# Patient Record
Sex: Female | Born: 1995 | Hispanic: Yes | Marital: Single | Smoking: Current some day smoker
Health system: Southern US, Community
[De-identification: ages and names within clinical notes are randomized; demographics above are authoritative.]

## PROBLEM LIST (undated history)

## (undated) ENCOUNTER — Emergency Department (HOSPITAL_COMMUNITY): Admission: EM | Payer: Self-pay | Source: Home / Self Care

## (undated) ENCOUNTER — Inpatient Hospital Stay (HOSPITAL_COMMUNITY): Payer: Self-pay

## (undated) DIAGNOSIS — Z789 Other specified health status: Secondary | ICD-10-CM

## (undated) HISTORY — DX: Other specified health status: Z78.9

## (undated) HISTORY — PX: NO PAST SURGERIES: SHX2092

---
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
## 2014-03-12 ENCOUNTER — Emergency Department (HOSPITAL_COMMUNITY): Payer: Self-pay

## 2014-03-12 ENCOUNTER — Emergency Department (HOSPITAL_COMMUNITY)
Admission: EM | Admit: 2014-03-12 | Discharge: 2014-03-12 | Disposition: A | Discharge status: Home / Self Care | Payer: Self-pay | Attending: Emergency Medicine | Admitting: Emergency Medicine

## 2014-03-12 ENCOUNTER — Encounter (HOSPITAL_COMMUNITY): Payer: Self-pay | Admitting: Emergency Medicine

## 2014-03-12 DIAGNOSIS — Z331 Pregnant state, incidental: Secondary | ICD-10-CM | POA: Insufficient documentation

## 2014-03-12 DIAGNOSIS — Z3490 Encounter for supervision of normal pregnancy, unspecified, unspecified trimester: Secondary | ICD-10-CM

## 2014-03-12 LAB — URINALYSIS, ROUTINE W REFLEX MICROSCOPIC
BILIRUBIN URINE: NEGATIVE
Glucose, UA: NEGATIVE mg/dL
HGB URINE DIPSTICK: NEGATIVE
Ketones, ur: NEGATIVE mg/dL
Leukocytes, UA: NEGATIVE
Nitrite: NEGATIVE
PROTEIN: NEGATIVE mg/dL
Specific Gravity, Urine: 1.01 (ref 1.005–1.030)
UROBILINOGEN UA: 0.2 mg/dL (ref 0.0–1.0)
pH: 6.5 (ref 5.0–8.0)

## 2014-03-12 LAB — COMPREHENSIVE METABOLIC PANEL
ALK PHOS: 82 U/L (ref 39–117)
ALT: 13 U/L (ref 0–35)
AST: 25 U/L (ref 0–37)
Albumin: 4 g/dL (ref 3.5–5.2)
BILIRUBIN TOTAL: 0.4 mg/dL (ref 0.3–1.2)
BUN: 8 mg/dL (ref 6–23)
CHLORIDE: 104 meq/L (ref 96–112)
CO2: 26 mEq/L (ref 19–32)
Calcium: 9.1 mg/dL (ref 8.4–10.5)
Creatinine, Ser: 0.57 mg/dL (ref 0.50–1.10)
GFR calc non Af Amer: >90 mL/min (ref >90)
GLUCOSE: 98 mg/dL (ref 70–99)
POTASSIUM: 4.1 meq/L (ref 3.7–5.3)
Sodium: 141 mEq/L (ref 137–147)
TOTAL PROTEIN: 7.5 g/dL (ref 6.0–8.3)

## 2014-03-12 LAB — CBC WITH DIFFERENTIAL/PLATELET
Basophils Absolute: 0 10*3/uL (ref 0.0–0.1)
Basophils Relative: 0 % (ref 0–1)
EOS ABS: 0.2 10*3/uL (ref 0.0–0.7)
Eosinophils Relative: 3 % (ref 0–5)
HEMATOCRIT: 40.8 % (ref 36.0–46.0)
HEMOGLOBIN: 14.3 g/dL (ref 12.0–15.0)
LYMPHS ABS: 2.4 10*3/uL (ref 0.7–4.0)
Lymphocytes Relative: 32 % (ref 12–46)
MCH: 32.1 pg (ref 26.0–34.0)
MCHC: 35 g/dL (ref 30.0–36.0)
MCV: 91.5 fL (ref 78.0–100.0)
MONO ABS: 0.6 10*3/uL (ref 0.1–1.0)
MONOS PCT: 8 % (ref 3–12)
NEUTROS PCT: 57 % (ref 43–77)
Neutro Abs: 4.3 10*3/uL (ref 1.7–7.7)
Platelets: 279 10*3/uL (ref 150–400)
RBC: 4.46 MIL/uL (ref 3.87–5.11)
RDW: 13.4 % (ref 11.5–15.5)
WBC: 7.6 10*3/uL (ref 4.0–10.5)

## 2014-03-12 LAB — HCG, QUANTITATIVE, PREGNANCY: HCG, BETA CHAIN, QUANT, S: 1302 m[IU]/mL — AB (ref <5)

## 2014-03-12 LAB — PREGNANCY, URINE: Preg Test, Ur: POSITIVE — AB

## 2014-03-12 LAB — ABO/RH: ABO/RH(D): O POS

## 2014-03-12 NOTE — ED Notes (Signed)
lmp 01/20/2014. States she had a positive pregnancy test 2 days ago, states she has pain in her abdomen today and some bleeding when she wipes

## 2014-03-12 NOTE — Discharge Instructions (Signed)
Follow up with dr. Elonda Husky next week.

## 2014-03-12 NOTE — ED Provider Notes (Signed)
CSN: [id number]     Arrival date & time 03/12/14  1652 History  This chart was scribed for Ashlee Diego, MD by Randa Evens, ED Scribe. This patient was seen in room APA08/APA08 and the patient's care was started at 5:56 PM.    Chief Complaint  Patient presents with  . Vaginal Bleeding      Patient is a 18 y.o. female presenting with vaginal bleeding. The history is provided by the patient. No language interpreter was used.  Vaginal Bleeding Quality:  Spotting Severity:  Mild Onset quality:  Sudden Duration:  1 day Timing:  Intermittent Progression:  Unchanged Chronicity:  New Possible pregnancy: yes   Relieved by:  None tried Worsened by:  Nothing tried Ineffective treatments:  None tried Associated symptoms: abdominal pain   Associated symptoms: no back pain and no fatigue    HPI Comments: Ashlee Vincent is a 18 y.o. female who presents to the Emergency Department complaining of vaginal bleeding described as "spotty" onset today. She states she has associated symptoms of mild suprapubic abdominal pain. Pt says her she took a positive. pregnancy test 2 days ago and her LNMP was  01/20/2014   History reviewed. No pertinent past medical history. History reviewed. No pertinent past surgical history. No family history on file. History  Substance Use Topics  . Smoking status: Never Smoker   . Smokeless tobacco: Not on file  . Alcohol Use: No   OB History   Grav Para Term Preterm Abortions TAB SAB Ect Mult Living                 Review of Systems  Constitutional: Negative for appetite change and fatigue.  HENT: Negative for congestion, ear discharge and sinus pressure.   Eyes: Negative for discharge.  Respiratory: Negative for cough.   Cardiovascular: Negative for chest pain.  Gastrointestinal: Positive for abdominal pain. Negative for diarrhea.  Genitourinary: Positive for vaginal bleeding. Negative for frequency and hematuria.  Musculoskeletal: Negative for  back pain.  Skin: Negative for rash.  Neurological: Negative for seizures and headaches.  Psychiatric/Behavioral: Negative for hallucinations.      Allergies  Review of patient's allergies indicates no known allergies.  Home Medications   Prior to Admission medications   Medication Sig Start Date End Date Taking? Authorizing Provider  acetaminophen (TYLENOL) 500 MG tablet Take 500 mg by mouth every 6 (six) hours as needed. Pain    Historical Provider, MD  etonogestrel (IMPLANON) 68 MG IMPL implant Inject 1 each into the skin once.    Historical Provider, MD  ibuprofen (ADVIL,MOTRIN) 800 MG tablet Take one every 8 hours for pain 06/29/12   Ashlee Diego, MD  promethazine (PHENERGAN) 25 MG tablet Take 1 tablet (25 mg total) by mouth every 6 (six) hours as needed for nausea. 06/29/12 07/06/12  Ashlee Diego, MD   Triage Vitals: BP 111/58  Pulse 65  Temp(Src) 98 F (36.7 C) (Oral)  Resp 20  Ht 4\' 11"  (1.499 m)  Wt 180 lb (81.647 kg)  BMI 36.34 kg/m2  SpO2 100%  LMP 01/20/2014  Physical Exam  Constitutional: She is oriented to person, place, and time. She appears well-developed.  HENT:  Head: Normocephalic.  Eyes: Conjunctivae and EOM are normal. No scleral icterus.  Neck: Neck supple. No thyromegaly present.  Cardiovascular: Normal rate and regular rhythm.  Exam reveals no gallop and no friction rub.   No murmur heard. Pulmonary/Chest: No stridor. She has no wheezes. She has no rales.  She exhibits no tenderness.  Abdominal: She exhibits no distension. There is tenderness. There is no rebound.  Mild suprapubic tenderness   Musculoskeletal: Normal range of motion. She exhibits no edema.  Lymphadenopathy:    She has no cervical adenopathy.  Neurological: She is oriented to person, place, and time. She exhibits normal muscle tone. Coordination normal.  Skin: No rash noted. No erythema.  Psychiatric: She has a normal mood and affect. Her behavior is normal.    ED Course   Procedures (including critical care time) DIAGNOSTIC STUDIES: Oxygen Saturation is 100% on RA, normal by my interpretation.    COORDINATION OF CARE: 5:59 PM-Discussed treatment plan which includes CBC panel, CMP, UA, Pregnancy Test with pt at bedside and pt agreed to plan. Will also perform a pelvic exam.    Labs Review Labs Reviewed  HCG, QUANTITATIVE, PREGNANCY - Abnormal; Notable for the following:    hCG, Beta Chain, Quant, S 1302 (*)    All other components within normal limits  PREGNANCY, URINE - Abnormal; Notable for the following:    Preg Test, Ur POSITIVE (*)    All other components within normal limits  CBC WITH DIFFERENTIAL  COMPREHENSIVE METABOLIC PANEL  URINALYSIS, ROUTINE W REFLEX MICROSCOPIC  ABO/RH    Imaging Review No results found.   EKG Interpretation None      MDM   Final diagnoses:  None   The chart was scribed for me under my direct supervision.  I personally performed the history, physical, and medical decision making and all procedures in the evaluation of this patient.Ashlee Diego, MD 03/12/14 [phone]

## 2014-03-14 ENCOUNTER — Encounter (HOSPITAL_COMMUNITY): Payer: Self-pay | Admitting: Emergency Medicine

## 2014-03-14 ENCOUNTER — Encounter: Payer: Self-pay | Admitting: Obstetrics & Gynecology

## 2014-03-14 ENCOUNTER — Emergency Department (HOSPITAL_COMMUNITY)
Admission: EM | Admit: 2014-03-14 | Discharge: 2014-03-14 | Disposition: A | Discharge status: Home / Self Care | Payer: Self-pay | Attending: Emergency Medicine | Admitting: Emergency Medicine

## 2014-03-14 ENCOUNTER — Ambulatory Visit (INDEPENDENT_AMBULATORY_CARE_PROVIDER_SITE_OTHER): Payer: Self-pay | Admitting: Obstetrics & Gynecology

## 2014-03-14 VITALS — BP 90/70 | Wt 179.0 lb

## 2014-03-14 DIAGNOSIS — O9989 Other specified diseases and conditions complicating pregnancy, childbirth and the puerperium: Secondary | ICD-10-CM | POA: Insufficient documentation

## 2014-03-14 DIAGNOSIS — R142 Eructation: Secondary | ICD-10-CM

## 2014-03-14 DIAGNOSIS — R109 Unspecified abdominal pain: Secondary | ICD-10-CM

## 2014-03-14 DIAGNOSIS — R141 Gas pain: Secondary | ICD-10-CM | POA: Insufficient documentation

## 2014-03-14 DIAGNOSIS — O200 Threatened abortion: Secondary | ICD-10-CM | POA: Insufficient documentation

## 2014-03-14 DIAGNOSIS — O039 Complete or unspecified spontaneous abortion without complication: Secondary | ICD-10-CM

## 2014-03-14 DIAGNOSIS — R143 Flatulence: Secondary | ICD-10-CM

## 2014-03-14 DIAGNOSIS — N939 Abnormal uterine and vaginal bleeding, unspecified: Secondary | ICD-10-CM

## 2014-03-14 LAB — WET PREP, GENITAL
CLUE CELLS WET PREP: NONE SEEN
TRICH WET PREP: NONE SEEN
YEAST WET PREP: NONE SEEN

## 2014-03-14 LAB — HCG, QUANTITATIVE, PREGNANCY: hCG, Beta Chain, Quant, S: 1290 m[IU]/mL — ABNORMAL HIGH (ref <5)

## 2014-03-14 MED ORDER — HYDROCODONE-ACETAMINOPHEN 5-325 MG PO TABS: 2.0000 | ORAL_TABLET | ORAL | Status: DC | PRN

## 2014-03-14 MED ORDER — SODIUM CHLORIDE 0.9 % IV SOLN
Freq: Once | INTRAVENOUS | Status: AC
  Administered 2014-03-14: 01:00:00 via INTRAVENOUS

## 2014-03-14 MED ORDER — MORPHINE SULFATE 4 MG/ML IJ SOLN: 2.0000 mg | Freq: Once | INTRAMUSCULAR | Status: AC

## 2014-03-14 MED ORDER — MORPHINE SULFATE 2 MG/ML IJ SOLN
INTRAMUSCULAR | Status: AC
  Administered 2014-03-14: 2 mg via INTRAVENOUS
  Filled 2014-03-14: qty 1

## 2014-03-14 MED FILL — Hydrocodone-Acetaminophen Tab 5-325 MG: ORAL | Qty: 6 | Status: AC

## 2014-03-14 NOTE — Progress Notes (Signed)
Patient ID: Ashlee Vincent, female   DOB: 09-May-1996, 18 y.o.   MRN: [id number] Pt with flat HCGsx2 and sonogram with no definitve findings Repeat HCG on Wednesday and will call pt with report  Results for orders placed during the hospital encounter of 03/14/14 (from the past 168 hour(s))  HCG, QUANTITATIVE, PREGNANCY   Collection Time    03/14/14 12:45 AM      Result Value Ref Range   hCG, Beta Chain, Quant, S 1290 (*) <5 mIU/mL  WET PREP, GENITAL   Collection Time    03/14/14  1:03 AM      Result Value Ref Range   Yeast Wet Prep HPF POC NONE SEEN  NONE SEEN   Trich, Wet Prep NONE SEEN  NONE SEEN   Clue Cells Wet Prep HPF POC NONE SEEN  NONE SEEN   WBC, Wet Prep HPF POC FEW (*) NONE SEEN  Results for orders placed during the hospital encounter of 03/12/14 (from the past 168 hour(s))  URINALYSIS, ROUTINE W REFLEX MICROSCOPIC   Collection Time    03/12/14  5:43 PM      Result Value Ref Range   Color, Urine YELLOW  YELLOW   APPearance CLEAR  CLEAR   Specific Gravity, Urine 1.010  1.005 - 1.030   pH 6.5  5.0 - 8.0   Glucose, UA NEGATIVE  NEGATIVE mg/dL   Hgb urine dipstick NEGATIVE  NEGATIVE   Bilirubin Urine NEGATIVE  NEGATIVE   Ketones, ur NEGATIVE  NEGATIVE mg/dL   Protein, ur NEGATIVE  NEGATIVE mg/dL   Urobilinogen, UA 0.2  0.0 - 1.0 mg/dL   Nitrite NEGATIVE  NEGATIVE   Leukocytes, UA NEGATIVE  NEGATIVE  PREGNANCY, URINE   Collection Time    03/12/14  5:43 PM      Result Value Ref Range   Preg Test, Ur POSITIVE (*) NEGATIVE  CBC WITH DIFFERENTIAL   Collection Time    03/12/14  6:21 PM      Result Value Ref Range   WBC 7.6  4.0 - 10.5 K/uL   RBC 4.46  3.87 - 5.11 MIL/uL   Hemoglobin 14.3  12.0 - 15.0 g/dL   HCT 40.8  36.0 - 46.0 %   MCV 91.5  78.0 - 100.0 fL   MCH 32.1  26.0 - 34.0 pg   MCHC 35.0  30.0 - 36.0 g/dL   RDW 13.4  11.5 - 15.5 %   Platelets 279  150 - 400 K/uL   Neutrophils Relative % 57  43 - 77 %   Neutro Abs 4.3  1.7 - 7.7 K/uL   Lymphocytes  Relative 32  12 - 46 %   Lymphs Abs 2.4  0.7 - 4.0 K/uL   Monocytes Relative 8  3 - 12 %   Monocytes Absolute 0.6  0.1 - 1.0 K/uL   Eosinophils Relative 3  0 - 5 %   Eosinophils Absolute 0.2  0.0 - 0.7 K/uL   Basophils Relative 0  0 - 1 %   Basophils Absolute 0.0  0.0 - 0.1 K/uL  COMPREHENSIVE METABOLIC PANEL   Collection Time    03/12/14  6:21 PM      Result Value Ref Range   Sodium 141  137 - 147 mEq/L   Potassium 4.1  3.7 - 5.3 mEq/L   Chloride 104  96 - 112 mEq/L   CO2 26  19 - 32 mEq/L   Glucose, Bld 98  70 - 99 mg/dL  BUN 8  6 - 23 mg/dL   Creatinine, Ser 0.57  0.50 - 1.10 mg/dL   Calcium 9.1  8.4 - 10.5 mg/dL   Total Protein 7.5  6.0 - 8.3 g/dL   Albumin 4.0  3.5 - 5.2 g/dL   AST 25  0 - 37 U/L   ALT 13  0 - 35 U/L   Alkaline Phosphatase 82  39 - 117 U/L   Total Bilirubin 0.4  0.3 - 1.2 mg/dL   GFR calc non Af Amer >90  >90 mL/min   GFR calc Af Amer >90  >90 mL/min  HCG, QUANTITATIVE, PREGNANCY   Collection Time    03/12/14  6:21 PM      Result Value Ref Range   hCG, Beta Chain, Quant, S 1302 (*) <5 mIU/mL  ABO/RH   Collection Time    03/12/14  6:24 PM      Result Value Ref Range   ABO/RH(D) O POS      US Ob Transvaginal  03/12/2014   CLINICAL DATA:  Vaginal bleeding.  Pregnant  EXAM: TRANSVAGINAL OB ULTRASOUND  TECHNIQUE: Transvaginal ultrasound was performed for complete evaluation of the gestation as well as the maternal uterus, adnexal regions, and pelvic cul-de-sac.  COMPARISON:  None.  FINDINGS: Intrauterine gestational sac: Present  Yolk sac:  Not identified  Embryo:  Not identified  MSD: 5.6  mm   5 w   2                   [place] EDC: 11/10/2014  Maternal uterus/adnexae: Normal ovaries. No free fluid. Corpus luteal cyst of the right ovary. Trace free fluid.  IMPRESSION: Probable early intrauterine gestational sac, but no yolk sac, fetal pole, or cardiac activity yet visualized. Recommend follow-up quantitative B-HCG levels and follow-up US in 14 days to confirm and  assess viability. This recommendation follows SRU consensus guidelines: Diagnostic Criteria for Nonviable Pregnancy Early in the First Trimester. [employer] Med 2013; 130:8657-84.   Electronically Signed   By: Suzy Bouchard M.D.   On: 03/12/2014 21:33    History reviewed. No pertinent past medical history.  History reviewed. No pertinent past surgical history.  OB History   Grav Para Term Preterm Abortions TAB SAB Ect Mult Living   1               No Known Allergies  History   Social History  . Marital Status: Single    Spouse Name: N/A    Number of Children: N/A  . Years of Education: N/A   Social History Main Topics  . Smoking status: Never Smoker   . Smokeless tobacco: None  . Alcohol Use: No  . Drug Use: No  . Sexual Activity: Yes    Birth Control/ Protection: Implant   Other Topics Concern  . None   Social History Narrative  . None    Family History  Problem Relation Age of Onset  . Hyperlipidemia Father   . Diabetes Maternal Grandmother   . Diabetes Paternal Grandmother

## 2014-03-14 NOTE — ED Notes (Signed)
Patient states she is [redacted] weeks pregnant; states she was seen here yesterday for vaginal bleeding and was told nothing was wrong.  Patient began having heavier bleeding that started now and c/o abdominal pain that is "stronger than period cramps" since 8pm.

## 2014-03-14 NOTE — ED Provider Notes (Signed)
CSN: [id number]     Arrival date & time 03/14/14  0027 History  This chart was scribed for Johnna Acosta, MD by Randa Evens, ED Scribe. This patient was seen in room APA01/APA01 and the patient's care was started at 12:33 AM.     Chief Complaint  Patient presents with  . Vaginal Bleeding   The history is provided by the patient. No language interpreter was used.   HPI Comments: Ashlee Vincent is a 18 y.o. female who presents to the Emergency Department complaining of suprapubic abdominal pain onset yesterday that improved today, but worsened and became severe tonight. She was seen in the ED yesterday with vaginal bleeding ("spotting") and mild suprapubic abdominal pain. She had an Ultrasound yesterday showing a gestational sac, but no obvious ectopic pregnancy. Her hCG quant yesterday was 1300. She states that today she wiped and noticed more blood than she noticed yesterday. She is G2P1A0 with a healthy 4 y.o. Child. She denies having similar pain during her 1st pregnancy. She denies any surgical abdominal history. She denies any history of cardiac or respiratory issues.   History reviewed. No pertinent past medical history. History reviewed. No pertinent past surgical history. No family history on file. History  Substance Use Topics  . Smoking status: Never Smoker   . Smokeless tobacco: Not on file  . Alcohol Use: No   OB History   Grav Para Term Preterm Abortions TAB SAB Ect Mult Living   1              Review of Systems  Gastrointestinal: Positive for abdominal distention. Negative for vomiting.      Allergies  Review of patient's allergies indicates no known allergies.  Home Medications   Prior to Admission medications   Medication Sig Start Date End Date Taking? Authorizing Provider  Multiple Vitamin (MULTIVITAMIN WITH MINERALS) TABS tablet Take 1 tablet by mouth daily.    Historical Provider, MD   BP 137/67  Pulse 104  Temp(Src) 98.1 F (36.7 C) (Oral)  Resp  20  Ht 4\' 11"  (1.499 m)  Wt 188 lb (85.276 kg)  BMI 37.95 kg/m2  SpO2 100%  LMP 01/20/2014  Physical Exam  Nursing note and vitals reviewed. Constitutional: She is oriented to person, place, and time. She appears well-developed and well-nourished.  Uncomfortable appearing  HENT:  Head: Normocephalic and atraumatic.  Eyes: Conjunctivae and EOM are normal.  Neck: Normal range of motion. Neck supple.  No meningeal signs  Cardiovascular: Normal rate, regular rhythm and normal heart sounds.  Exam reveals no gallop and no friction rub.   No murmur heard. Pulmonary/Chest: Effort normal and breath sounds normal. No respiratory distress. She has no wheezes. She has no rales. She exhibits no tenderness.  Abdominal: Soft. Bowel sounds are normal. She exhibits no distension and no mass. There is tenderness. There is no rebound and no guarding.  Suprapubic tenderness.   Genitourinary:  Chaperone present for exam, has normal appearing external genitalia - blood present in the vaginal vault.  Cervix open with blood and tissue at os, no CMT, no adnexal ttp or masses.  No FB, no d/c, no foul odor  Musculoskeletal: Normal range of motion. She exhibits no edema and no tenderness.  Neurological: She is alert and oriented to person, place, and time. No cranial nerve deficit.  Skin: Skin is warm and dry. She is not diaphoretic. No erythema.    ED Course  Procedures (including critical care time) DIAGNOSTIC STUDIES: Oxygen Saturation is  100%% on ra normal,  by my interpretation.    COORDINATION OF CARE:  12:38 AM-Discussed treatment plan which includes pelvic exam, hCG and medications with pt at bedside and pt agreed to plan.    Labs Review Labs Reviewed  WET PREP, GENITAL - Abnormal; Notable for the following:    WBC, Wet Prep HPF POC FEW (*)    All other components within normal limits  HCG, QUANTITATIVE, PREGNANCY - Abnormal; Notable for the following:    hCG, Beta Chain, Quant, S 1290 (*)     All other components within normal limits  GC/CHLAMYDIA PROBE AMP    Imaging Review US Ob Transvaginal  03/12/2014   CLINICAL DATA:  Vaginal bleeding.  Pregnant  EXAM: TRANSVAGINAL OB ULTRASOUND  TECHNIQUE: Transvaginal ultrasound was performed for complete evaluation of the gestation as well as the maternal uterus, adnexal regions, and pelvic cul-de-sac.  COMPARISON:  None.  FINDINGS: Intrauterine gestational sac: Present  Yolk sac:  Not identified  Embryo:  Not identified  MSD: 5.6  mm   5 w   2                   [place] EDC: 11/10/2014  Maternal uterus/adnexae: Normal ovaries. No free fluid. Corpus luteal cyst of the right ovary. Trace free fluid.  IMPRESSION: Probable early intrauterine gestational sac, but no yolk sac, fetal pole, or cardiac activity yet visualized. Recommend follow-up quantitative B-HCG levels and follow-up US in 14 days to confirm and assess viability. This recommendation follows SRU consensus guidelines: Diagnostic Criteria for Nonviable Pregnancy Early in the First Trimester. [employer] Med 2013; 580:9983-38.   Electronically Signed   By: Suzy Bouchard M.D.   On: 03/12/2014 21:33      MDM   Final diagnoses:  Threatened miscarriage  Vaginal bleeding  Abdominal pain    The pt is likely having a miscarriage - this is a threatened abortion, she has a quant pending, VS unremarkable and abd exam unremarkable - doubt ectopic given US findings from yesterday (see above) and presence of blood and tissue with open cervical os.  Pain meds given.  Quant unchanged from last check - d/w Dr. Elonda Husky - he agrees with f/u as outpt - no indication for transfer for further testing this evening.  I personally performed the services described in this documentation, which was scribed in my presence. The recorded information has been reviewed and is accurate.       Johnna Acosta, MD 03/14/14 [phone]

## 2014-03-15 LAB — GC/CHLAMYDIA PROBE AMP
CT Probe RNA: NEGATIVE
GC Probe RNA: NEGATIVE

## 2014-03-16 ENCOUNTER — Other Ambulatory Visit: Payer: Self-pay

## 2014-03-16 DIAGNOSIS — Z3200 Encounter for pregnancy test, result unknown: Secondary | ICD-10-CM

## 2014-03-17 LAB — HCG, QUANTITATIVE, PREGNANCY: HCG, BETA CHAIN, QUANT, S: 246.8 m[IU]/mL

## 2014-06-06 ENCOUNTER — Other Ambulatory Visit: Payer: Self-pay | Admitting: Obstetrics and Gynecology

## 2014-06-06 DIAGNOSIS — O3680X0 Pregnancy with inconclusive fetal viability, not applicable or unspecified: Secondary | ICD-10-CM

## 2014-06-08 ENCOUNTER — Ambulatory Visit (INDEPENDENT_AMBULATORY_CARE_PROVIDER_SITE_OTHER): Payer: Medicaid Other

## 2014-06-08 ENCOUNTER — Other Ambulatory Visit: Payer: Self-pay | Admitting: Obstetrics and Gynecology

## 2014-06-08 DIAGNOSIS — O26849 Uterine size-date discrepancy, unspecified trimester: Secondary | ICD-10-CM

## 2014-06-08 DIAGNOSIS — O09299 Supervision of pregnancy with other poor reproductive or obstetric history, unspecified trimester: Secondary | ICD-10-CM

## 2014-06-08 DIAGNOSIS — O3680X0 Pregnancy with inconclusive fetal viability, not applicable or unspecified: Secondary | ICD-10-CM

## 2014-06-08 NOTE — Progress Notes (Signed)
U/S-single IUP with +FCA noted, FHR-120 bpm, CRL c/w 5+5 wks , EDD 02/03/2015, cx appears closed, bilateral adnexa appears WNL with C.L. Noted on Rt, no free fluid noted within the pelvis

## 2014-06-13 ENCOUNTER — Encounter: Payer: Self-pay | Admitting: Women's Health

## 2014-06-13 ENCOUNTER — Ambulatory Visit (INDEPENDENT_AMBULATORY_CARE_PROVIDER_SITE_OTHER): Payer: Medicaid Other | Admitting: Women's Health

## 2014-06-13 VITALS — BP 114/60 | Wt 178.0 lb

## 2014-06-13 DIAGNOSIS — Z331 Pregnant state, incidental: Secondary | ICD-10-CM | POA: Diagnosis not present

## 2014-06-13 DIAGNOSIS — Z1389 Encounter for screening for other disorder: Secondary | ICD-10-CM

## 2014-06-13 DIAGNOSIS — Z3480 Encounter for supervision of other normal pregnancy, unspecified trimester: Secondary | ICD-10-CM | POA: Diagnosis not present

## 2014-06-13 DIAGNOSIS — Z3481 Encounter for supervision of other normal pregnancy, first trimester: Secondary | ICD-10-CM

## 2014-06-13 LAB — CBC
HCT: 39.5 % (ref 36.0–46.0)
Hemoglobin: 13.7 g/dL (ref 12.0–15.0)
MCH: 31.7 pg (ref 26.0–34.0)
MCHC: 34.7 g/dL (ref 30.0–36.0)
MCV: 91.4 fL (ref 78.0–100.0)
PLATELETS: 299 10*3/uL (ref 150–400)
RBC: 4.32 MIL/uL (ref 3.87–5.11)
RDW: 14 % (ref 11.5–15.5)
WBC: 6.8 10*3/uL (ref 4.0–10.5)

## 2014-06-13 LAB — POCT URINALYSIS DIPSTICK
Blood, UA: NEGATIVE
GLUCOSE UA: NEGATIVE
Ketones, UA: NEGATIVE
LEUKOCYTES UA: NEGATIVE
Nitrite, UA: NEGATIVE
Protein, UA: NEGATIVE

## 2014-06-13 NOTE — Progress Notes (Addendum)
  Subjective:  Ashlee Vincent is a 18 y.o. G41P1011 Hispanic female at [redacted]w[redacted]d by 5wk u/s, being seen today for her first obstetrical visit.  Her obstetrical history is significant for term uncomplicated SVD @ 71HA and recent SAB April 2015.  Pregnancy history fully reviewed.  Patient reports nausea, no vomiting- declines meds. Denies vb, cramping, uti s/s, abnormal/malodorous vag d/c, or vulvovaginal itching/irritation.   BP 114/60  Wt 178 lb (80.74 kg)  LMP 04/21/2014  HISTORY: OB History  Gravida Para Term Preterm AB SAB TAB Ectopic Multiple Living  3 1 0 0 1 1 0 0 0 1     # Outcome Date GA Lbr Len/2nd Weight Sex Delivery Anes PTL Lv  3 CUR           2 SAB 02/16/14          1 PAR 02/21/10    F SVD   Y     Past Medical History  Diagnosis Date  . Medical history non-contributory    Past Surgical History  Procedure Laterality Date  . No past surgeries     Family History  Problem Relation Age of Onset  . Hyperlipidemia Father   . Diabetes Maternal Grandmother   . Diabetes Paternal Grandmother     Exam   System:     General: Well developed & nourished, no acute distress   Skin: Warm & dry, normal coloration and turgor, no rashes   Neurologic: Alert & oriented, normal mood   Cardiovascular: Regular rate & rhythm   Respiratory: Effort & rate normal, LCTAB, acyanotic   Abdomen: Soft, non tender   Extremities: normal strength, tone   Thin prep pap smear n/a <21yo  FHR: 114 via informal transvaginal u/s by JAG   Assessment:   Pregnancy: [id number] Patient Active Problem List   Diagnosis Date Noted  . Supervision of other normal pregnancy 06/13/2014    Priority: High  . Miscarriage 03/14/2014    [redacted]w[redacted]d G3P0011 New OB visit Recent SAB 02/2014   Plan:  Initial labs drawn Continue prenatal vitamins Problem list reviewed and updated Reviewed n/v relief measures and warning s/s to report Reviewed recommended weight gain based on pre-gravid BMI Encouraged well-balanced  diet Genetic Screening discussed Integrated Screen: requested Cystic fibrosis screening discussed requested Ultrasound discussed; fetal survey: requested Follow up in 4 weeks for visit [place] completed  Tawnya Crook CNM, [facility] 06/13/2014 4:47 PM

## 2014-06-13 NOTE — Patient Instructions (Signed)
Nausea & Vomiting  Have saltine crackers or pretzels by your bed and eat a few bites before you raise your head out of bed in the morning  Eat small frequent meals throughout the day instead of large meals  Drink plenty of fluids throughout the day to stay hydrated, just don't drink a lot of fluids with your meals.  This can make your stomach fill up faster making you feel sick  Do not brush your teeth right after you eat  Products with real ginger are good for nausea, like ginger ale and ginger hard candy Make sure it says made with real ginger!  Sucking on sour candy like lemon heads is also good for nausea  If your prenatal vitamins make you nauseated, take them at night so you will sleep through the nausea  If you feel like you need medicine for the nausea & vomiting please let us know  If you are unable to keep any fluids or food down please let us know  Sea Bands    First Trimester of Pregnancy The first trimester of pregnancy is from week 1 until the end of week 12 (months 1 through 3). A week after a sperm fertilizes an egg, the egg will implant on the wall of the uterus. This embryo will begin to develop into a baby. Genes from you and your partner are forming the baby. The female genes determine whether the baby is a boy or a girl. At 6-8 weeks, the eyes and face are formed, and the heartbeat can be seen on ultrasound. At the end of 12 weeks, all the baby's organs are formed.  Now that you are pregnant, you will want to do everything you can to have a healthy baby. Two of the most important things are to get good prenatal care and to follow your health care provider's instructions. Prenatal care is all the medical care you receive before the baby's birth. This care will help prevent, find, and treat any problems during the pregnancy and childbirth. BODY CHANGES Your body goes through many changes during pregnancy. The changes vary from woman to woman.   You may gain or lose a  couple of pounds at first.  You may feel sick to your stomach (nauseous) and throw up (vomit). If the vomiting is uncontrollable, call your health care provider.  You may tire easily.  You may develop headaches that can be relieved by medicines approved by your health care provider.  You may urinate more often. Painful urination may mean you have a bladder infection.  You may develop heartburn as a result of your pregnancy.  You may develop constipation because certain hormones are causing the muscles that push waste through your intestines to slow down.  You may develop hemorrhoids or swollen, bulging veins (varicose veins).  Your breasts may begin to grow larger and become tender. Your nipples may stick out more, and the tissue that surrounds them (areola) may become darker.  Your gums may bleed and may be sensitive to brushing and flossing.  Dark spots or blotches (chloasma, mask of pregnancy) may develop on your face. This will likely fade after the baby is born.  Your menstrual periods will stop.  You may have a loss of appetite.  You may develop cravings for certain kinds of food.  You may have changes in your emotions from day to day, such as being excited to be pregnant or being concerned that something may go wrong with the pregnancy and  You may have more vivid and strange dreams.  You may have changes in your hair. These can include thickening of your hair, rapid growth, and changes in texture. Some women also have hair loss during or after pregnancy, or hair that feels dry or thin. Your hair will most likely return to normal after your baby is born. WHAT TO EXPECT AT YOUR PRENATAL VISITS During a routine prenatal visit:  You will be weighed to make sure you and the baby are growing normally.  Your blood pressure will be taken.  Your abdomen will be measured to track your baby's growth.  The fetal heartbeat will be listened to starting around week 10 or 12  of your pregnancy.  Test results from any previous visits will be discussed. Your health care provider may ask you:  How you are feeling.  If you are feeling the baby move.  If you have had any abnormal symptoms, such as leaking fluid, bleeding, severe headaches, or abdominal cramping.  If you have any questions. Other tests that may be performed during your first trimester include:  Blood tests to find your blood type and to check for the presence of any previous infections. They will also be used to check for low iron levels (anemia) and Rh antibodies. Later in the pregnancy, blood tests for diabetes will be done along with other tests if problems develop.  Urine tests to check for infections, diabetes, or protein in the urine.  An ultrasound to confirm the proper growth and development of the baby.  An amniocentesis to check for possible genetic problems.  Fetal screens for spina bifida and Down syndrome.  You may need other tests to make sure you and the baby are doing well. HOME CARE INSTRUCTIONS  Medicines  Follow your health care provider's instructions regarding medicine use. Specific medicines may be either safe or unsafe to take during pregnancy.  Take your prenatal vitamins as directed.  If you develop constipation, try taking a stool softener if your health care provider approves. Diet  Eat regular, well-balanced meals. Choose a variety of foods, such as meat or vegetable-based protein, fish, milk and low-fat dairy products, vegetables, fruits, and whole grain breads and cereals. Your health care provider will help you determine the amount of weight gain that is right for you.  Avoid raw meat and uncooked cheese. These carry germs that can cause birth defects in the baby.  Eating four or five small meals rather than three large meals a day may help relieve nausea and vomiting. If you start to feel nauseous, eating a few soda crackers can be helpful. Drinking liquids  between meals instead of during meals also seems to help nausea and vomiting.  If you develop constipation, eat more high-fiber foods, such as fresh vegetables or fruit and whole grains. Drink enough fluids to keep your urine clear or pale yellow. Activity and Exercise  Exercise only as directed by your health care provider. Exercising will help you:  Control your weight.  Stay in shape.  Be prepared for labor and delivery.  Experiencing pain or cramping in the lower abdomen or low back is a good sign that you should stop exercising. Check with your health care provider before continuing normal exercises.  Try to avoid standing for long periods of time. Move your legs often if you must stand in one place for a long time.  Avoid heavy lifting.  Wear low-heeled shoes, and practice good posture.  You may continue to have   sex unless your health care provider directs you otherwise. Relief of Pain or Discomfort  Wear a good support bra for breast tenderness.   Take warm sitz baths to soothe any pain or discomfort caused by hemorrhoids. Use hemorrhoid cream if your health care provider approves.   Rest with your legs elevated if you have leg cramps or low back pain.  If you develop varicose veins in your legs, wear support hose. Elevate your feet for 15 minutes, 3-4 times a day. Limit salt in your diet. Prenatal Care  Schedule your prenatal visits by the twelfth week of pregnancy. They are usually scheduled monthly at first, then more often in the last 2 months before delivery.  Write down your questions. Take them to your prenatal visits.  Keep all your prenatal visits as directed by your health care provider. Safety  Wear your seat belt at all times when driving.  Make a list of emergency phone numbers, including numbers for family, friends, the hospital, and police and fire departments. General Tips  Ask your health care provider for a referral to a local prenatal education  class. Begin classes no later than at the beginning of month 6 of your pregnancy.  Ask for help if you have counseling or nutritional needs during pregnancy. Your health care provider can offer advice or refer you to specialists for help with various needs.  Do not use hot tubs, steam rooms, or saunas.  Do not douche or use tampons or scented sanitary pads.  Do not cross your legs for long periods of time.  Avoid cat litter boxes and soil used by cats. These carry germs that can cause birth defects in the baby and possibly loss of the fetus by miscarriage or stillbirth.  Avoid all smoking, herbs, alcohol, and medicines not prescribed by your health care provider. Chemicals in these affect the formation and growth of the baby.  Schedule a dentist appointment. At home, brush your teeth with a soft toothbrush and be gentle when you floss. SEEK MEDICAL CARE IF:   You have dizziness.  You have mild pelvic cramps, pelvic pressure, or nagging pain in the abdominal area.  You have persistent nausea, vomiting, or diarrhea.  You have a bad smelling vaginal discharge.  You have pain with urination.  You notice increased swelling in your face, hands, legs, or ankles. SEEK IMMEDIATE MEDICAL CARE IF:   You have a fever.  You are leaking fluid from your vagina.  You have spotting or bleeding from your vagina.  You have severe abdominal cramping or pain.  You have rapid weight gain or loss.  You vomit blood or material that looks like coffee grounds.  You are exposed to German measles and have never had them.  You are exposed to fifth disease or chickenpox.  You develop a severe headache.  You have shortness of breath.  You have any kind of trauma, such as from a fall or a car accident. Document Released: 10/29/2001 Document Revised: 03/21/2014 Document Reviewed: 09/14/2013 ExitCare Patient Information 2015 [employer]. This information is not intended to replace advice given  to you by your health care provider. Make sure you discuss any questions you have with your health care provider.  

## 2014-06-14 ENCOUNTER — Encounter: Payer: Self-pay | Admitting: Women's Health

## 2014-06-14 LAB — DRUG SCREEN, URINE, NO CONFIRMATION
Amphetamine Screen, Ur: NEGATIVE
Barbiturate Quant, Ur: NEGATIVE
Benzodiazepines.: NEGATIVE
CREATININE, U: 78 mg/dL
Cocaine Metabolites: NEGATIVE
Marijuana Metabolite: NEGATIVE
Methadone: NEGATIVE
OPIATE SCREEN, URINE: NEGATIVE
PHENCYCLIDINE (PCP): NEGATIVE
PROPOXYPHENE: NEGATIVE

## 2014-06-14 LAB — HIV ANTIBODY (ROUTINE TESTING W REFLEX): HIV 1&2 Ab, 4th Generation: NONREACTIVE

## 2014-06-14 LAB — GC/CHLAMYDIA PROBE AMP
CT PROBE, AMP APTIMA: NEGATIVE
GC Probe RNA: NEGATIVE

## 2014-06-14 LAB — ABO AND RH: RH TYPE: POSITIVE

## 2014-06-14 LAB — URINALYSIS, ROUTINE W REFLEX MICROSCOPIC
BILIRUBIN URINE: NEGATIVE
GLUCOSE, UA: NEGATIVE mg/dL
Hgb urine dipstick: NEGATIVE
Ketones, ur: NEGATIVE mg/dL
Leukocytes, UA: NEGATIVE
Nitrite: NEGATIVE
PROTEIN: NEGATIVE mg/dL
SPECIFIC GRAVITY, URINE: 1.014 (ref 1.005–1.030)
Urobilinogen, UA: 0.2 mg/dL (ref 0.0–1.0)
pH: 7 (ref 5.0–8.0)

## 2014-06-14 LAB — OXYCODONE SCREEN, UA, RFLX CONFIRM: Oxycodone Screen, Ur: NEGATIVE ng/mL

## 2014-06-14 LAB — SICKLE CELL SCREEN: SICKLE CELL SCREEN: NEGATIVE

## 2014-06-14 LAB — ANTIBODY SCREEN: ANTIBODY SCREEN: NEGATIVE

## 2014-06-14 LAB — RPR

## 2014-06-14 LAB — HEPATITIS B SURFACE ANTIGEN: Hepatitis B Surface Ag: NEGATIVE

## 2014-06-14 LAB — RUBELLA SCREEN: Rubella: 3.08 Index — ABNORMAL HIGH (ref <0.90)

## 2014-06-14 LAB — VARICELLA ZOSTER ANTIBODY, IGG: Varicella IgG: >4000 Index — ABNORMAL HIGH (ref <135.00)

## 2014-06-16 LAB — URINE CULTURE

## 2014-06-21 ENCOUNTER — Telehealth: Payer: Self-pay | Admitting: Women's Health

## 2014-06-21 DIAGNOSIS — O2341 Unspecified infection of urinary tract in pregnancy, first trimester: Secondary | ICD-10-CM

## 2014-06-21 MED ORDER — AMPICILLIN 500 MG PO CAPS: 500.0000 mg | ORAL_CAPSULE | Freq: Four times a day (QID) | ORAL | Status: DC

## 2014-06-21 NOTE — Telephone Encounter (Signed)
Notified of UTI, rx sent to pharmacy. Roma Schanz, CNM, [facility] 06/21/2014 4:49 PM

## 2014-06-22 ENCOUNTER — Encounter: Payer: Self-pay | Admitting: Women's Health

## 2014-06-27 ENCOUNTER — Encounter: Payer: Self-pay | Admitting: Women's Health

## 2014-06-27 ENCOUNTER — Other Ambulatory Visit: Payer: Self-pay | Admitting: Women's Health

## 2014-06-27 ENCOUNTER — Ambulatory Visit (INDEPENDENT_AMBULATORY_CARE_PROVIDER_SITE_OTHER): Payer: Medicaid Other

## 2014-06-27 ENCOUNTER — Ambulatory Visit (INDEPENDENT_AMBULATORY_CARE_PROVIDER_SITE_OTHER): Payer: Medicaid Other | Admitting: Women's Health

## 2014-06-27 VITALS — BP 108/58 | Wt 179.0 lb

## 2014-06-27 DIAGNOSIS — O200 Threatened abortion: Secondary | ICD-10-CM

## 2014-06-27 DIAGNOSIS — O039 Complete or unspecified spontaneous abortion without complication: Secondary | ICD-10-CM | POA: Diagnosis not present

## 2014-06-27 DIAGNOSIS — Z1389 Encounter for screening for other disorder: Secondary | ICD-10-CM

## 2014-06-27 DIAGNOSIS — Z331 Pregnant state, incidental: Secondary | ICD-10-CM

## 2014-06-27 LAB — POCT URINALYSIS DIPSTICK
Blood, UA: NEGATIVE
Glucose, UA: NEGATIVE
Ketones, UA: NEGATIVE
Leukocytes, UA: NEGATIVE
NITRITE UA: NEGATIVE
PROTEIN UA: NEGATIVE

## 2014-06-27 NOTE — Progress Notes (Signed)
Work-in Low-risk OB appointment G3P1011 [redacted]w[redacted]d Estimated Date of Delivery: 02/03/15 BP 108/58  Wt 179 lb (81.194 kg)  LMP 04/21/2014  BP, weight, and urine reviewed.  Refer to obstetrical flow sheet for FH & FHR.  Cramping and bleeding began yesterday am- spotting at first, then heavier- but stopped last night. Spotting started again this am.  Spec exam: small amt light red blood in vault, no active bleeding from closed cervix U/S w/ Tasha, CRL 6+wk size, no FCA verified, some blood in cx, but none around implantation site. Discussed expectant management vs. Cytotec, pt wishes for expectant management at this time. Had SAB in April @ 5wks, passed POC w/ expectant management.  O+ blood type. Will get bhcg today. To call if she decides she'd rather do cytotec.  F/U in 1wk

## 2014-06-27 NOTE — Progress Notes (Signed)
Single IUP with NO FCA noted on today's exam, cx appears closed with small amount of fluid noted within the cavity, bilateral adnexa appears WNL, no active bleeding noted around gestational sac, CRL c/w 6+5 wks

## 2014-06-27 NOTE — Patient Instructions (Signed)
Miscarriage A miscarriage is the sudden loss of an unborn baby (fetus) before the 20th week of pregnancy. Most miscarriages happen in the first 3 months of pregnancy. Sometimes, it happens before a woman even knows she is pregnant. A miscarriage is also called a "spontaneous miscarriage" or "early pregnancy loss." Having a miscarriage can be an emotional experience. Talk with your caregiver about any questions you may have about miscarrying, the grieving process, and your future pregnancy plans. CAUSES   Problems with the fetal chromosomes that make it impossible for the baby to develop normally. Problems with the baby's genes or chromosomes are most often the result of errors that occur, by chance, as the embryo divides and grows. The problems are not inherited from the parents.  Infection of the cervix or uterus.   Hormone problems.   Problems with the cervix, such as having an incompetent cervix. This is when the tissue in the cervix is not strong enough to hold the pregnancy.   Problems with the uterus, such as an abnormally shaped uterus, uterine fibroids, or congenital abnormalities.   Certain medical conditions.   Smoking, drinking alcohol, or taking illegal drugs.   Trauma.  Often, the cause of a miscarriage is unknown.  SYMPTOMS   Vaginal bleeding or spotting, with or without cramps or pain.  Pain or cramping in the abdomen or lower back.  Passing fluid, tissue, or blood clots from the vagina. DIAGNOSIS  Your caregiver will perform a physical exam. You may also have an ultrasound to confirm the miscarriage. Blood or urine tests may also be ordered. TREATMENT   Sometimes, treatment is not necessary if you naturally pass all the fetal tissue that was in the uterus. If some of the fetus or placenta remains in the body (incomplete miscarriage), tissue left behind may become infected and must be removed. Usually, a dilation and curettage (D and C) procedure is performed.  During a D and C procedure, the cervix is widened (dilated) and any remaining fetal or placental tissue is gently removed from the uterus.  Antibiotic medicines are prescribed if there is an infection. Other medicines may be given to reduce the size of the uterus (contract) if there is a lot of bleeding.  If you have Rh negative blood and your baby was Rh positive, you will need a Rh immunoglobulin shot. This shot will protect any future baby from having Rh blood problems in future pregnancies. HOME CARE INSTRUCTIONS   Your caregiver may order bed rest or may allow you to continue light activity. Resume activity as directed by your caregiver.  Have someone help with home and family responsibilities during this time.   Keep track of the number of sanitary pads you use each day and how soaked (saturated) they are. Write down this information.   Do not use tampons. Do not douche or have sexual intercourse until approved by your caregiver.   Only take over-the-counter or prescription medicines for pain or discomfort as directed by your caregiver.   Do not take aspirin. Aspirin can cause bleeding.   Keep all follow-up appointments with your caregiver.   If you or your partner have problems with grieving, talk to your caregiver or seek counseling to help cope with the pregnancy loss. Allow enough time to grieve before trying to get pregnant again.  SEEK IMMEDIATE MEDICAL CARE IF:   You have severe cramps or pain in your back or abdomen.  You have a fever.  You pass large blood clots (walnut-sized   or larger) ortissue from your vagina. Save any tissue for your caregiver to inspect.   Your bleeding increases.   You have a thick, bad-smelling vaginal discharge.  You become lightheaded, weak, or you faint.   You have chills.  MAKE SURE YOU:  Understand these instructions.  Will watch your condition.  Will get help right away if you are not doing well or get  worse. Document Released: 04/30/2001 Document Revised: 03/01/2013 Document Reviewed: 12/24/2011 ExitCare Patient Information 2015 [employer]. This information is not intended to replace advice given to you by your health care provider. Make sure you discuss any questions you have with your health care provider.  

## 2014-06-28 ENCOUNTER — Telehealth: Payer: Self-pay | Admitting: *Deleted

## 2014-06-28 LAB — HCG, QUANTITATIVE, PREGNANCY: hCG, Beta Chain, Quant, S: 2590.3 m[IU]/mL

## 2014-06-28 MED ORDER — MISOPROSTOL 200 MCG PO TABS: 800.0000 ug | ORAL_TABLET | Freq: Once | ORAL | Status: DC

## 2014-06-28 MED ORDER — HYDROCODONE-ACETAMINOPHEN 5-325 MG PO TABS: 1.0000 | ORAL_TABLET | Freq: Four times a day (QID) | ORAL | Status: DC | PRN

## 2014-06-28 NOTE — Telephone Encounter (Signed)
Pt had called requesting cytotec for missed AB. Returned her call notifying her I sent in rx to her pharmacy, to take cytotec 829mcg po x 1, if needed can repeat in 48hrs. Rx for vicodin at front desk for her to pick up. Has f/u appt w/ [place] next week.  Roma Schanz, CNM, [facility] 06/28/2014 2:19 PM

## 2014-06-30 ENCOUNTER — Telehealth: Payer: Self-pay | Admitting: Women's Health

## 2014-06-30 NOTE — Telephone Encounter (Signed)
Pt states took Cytotec on Tuesday, August 11,2015 as [name], CNM prescribed has had a little bleeding but no tissue. Pt states should she take the other 4 tablets of Cytotec. Per Derrek Monaco, NP pt to take the 4 tablets of Cytotec and keep her appt for Monday, August,17,2015. Pt verbalized understanding.

## 2014-07-04 ENCOUNTER — Encounter: Payer: Medicaid Other | Admitting: Women's Health

## 2014-07-04 ENCOUNTER — Other Ambulatory Visit: Payer: Medicaid Other

## 2014-07-04 ENCOUNTER — Ambulatory Visit (INDEPENDENT_AMBULATORY_CARE_PROVIDER_SITE_OTHER): Payer: Medicaid Other | Admitting: Women's Health

## 2014-07-04 VITALS — BP 98/58 | Ht 59.0 in | Wt 189.0 lb

## 2014-07-04 DIAGNOSIS — O2390 Unspecified genitourinary tract infection in pregnancy, unspecified trimester: Secondary | ICD-10-CM

## 2014-07-04 DIAGNOSIS — O039 Complete or unspecified spontaneous abortion without complication: Secondary | ICD-10-CM

## 2014-07-04 DIAGNOSIS — N390 Urinary tract infection, site not specified: Secondary | ICD-10-CM

## 2014-07-04 DIAGNOSIS — O2341 Unspecified infection of urinary tract in pregnancy, first trimester: Secondary | ICD-10-CM

## 2014-07-04 NOTE — Progress Notes (Signed)
Patient ID: Ashlee Vincent, female   DOB: 03-26-1996, 18 y.o.   MRN: [id number]   [facility] Visit  Patient name: Ashlee Vincent MRN [id number]  Date of birth: January 08, 1996  CC & HPI:  Ashlee Vincent is a 18 y.o. Hispanic female presenting today for f/u 1wk after dx of missed ab, took both doses of cytotec, had cramping and passed clots, still having mild cramping and light bleeding.   Pertinent History Reviewed:  Medical & Surgical Hx:   Past Medical History  Diagnosis Date  . Medical history non-contributory    Past Surgical History  Procedure Laterality Date  . No past surgeries     Medications: Reviewed & Updated - see associated section Social History: Reviewed -  reports that she has never smoked. She has never used smokeless tobacco.  Objective Findings:  Vitals: BP 98/58  Ht 4\' 11"  (1.499 m)  Wt 189 lb (85.73 kg)  BMI 38.15 kg/m2  LMP 04/21/2014  Physical Examination: General appearance - alert, well appearing, and in no distress  No results found for this or any previous visit (from the past 24 hour(s)).   Assessment & Plan:  A:   Missed ab, CRL last wk 6.5wks  P:  Will check bhcg again today to compare to last weeks, if not dropping as expected will get another u/s    F/U 1wk for f/u   Tawnya Crook CNM, [facility] 07/04/2014 2:18 PM

## 2014-07-05 ENCOUNTER — Telehealth: Payer: Self-pay | Admitting: Women's Health

## 2014-07-05 LAB — HCG, QUANTITATIVE, PREGNANCY: hCG, Beta Chain, Quant, S: 18.4 m[IU]/mL

## 2014-07-05 NOTE — Telephone Encounter (Signed)
Notified pt of sufficient drop in hcg from 2,590 last week to 18.4 yesterday s/p cytotec for missed ab. No need to be seen next week for f/u. Recommended continuing pnv, and waiting at least 3 months in between pregnancies.  Roma Schanz, CNM, [facility] 07/05/2014 1:32 PM

## 2014-07-11 ENCOUNTER — Encounter: Payer: Self-pay | Admitting: Obstetrics & Gynecology

## 2014-07-11 ENCOUNTER — Other Ambulatory Visit: Payer: Self-pay

## 2014-07-12 ENCOUNTER — Encounter: Payer: Self-pay | Admitting: Obstetrics & Gynecology

## 2014-09-19 ENCOUNTER — Encounter: Payer: Self-pay | Admitting: Women's Health

## 2014-11-18 NOTE — L&D Delivery Note (Signed)
Patient is 19 y.o. [id number] [redacted]w[redacted]d admitted for SOL.   Delivery Note At 12:46 AM a viable female was delivered via Vaginal, Spontaneous Delivery (Presentation: Right Occiput Anterior).  APGAR: 7, 9; weight pending.   Placenta status: Intact, Spontaneous. Cord: 3 vessels with the following complications: None.    Anesthesia: Local  Episiotomy: None Lacerations: 1st degree;Labial Suture Repair: 3.0 vicryl Est. Blood Loss (mL): 200  Mom to postpartum.  Baby to Couplet care / Skin to Skin.  Adin Hector, MD PGY-1 [facility]  08/14/2015, 1:35 AM

## 2014-12-27 ENCOUNTER — Other Ambulatory Visit: Payer: Self-pay | Admitting: Obstetrics & Gynecology

## 2014-12-27 DIAGNOSIS — O3680X0 Pregnancy with inconclusive fetal viability, not applicable or unspecified: Secondary | ICD-10-CM

## 2014-12-30 ENCOUNTER — Other Ambulatory Visit: Payer: Self-pay | Admitting: Obstetrics & Gynecology

## 2014-12-30 ENCOUNTER — Encounter: Payer: Self-pay | Admitting: Obstetrics & Gynecology

## 2014-12-30 ENCOUNTER — Ambulatory Visit (INDEPENDENT_AMBULATORY_CARE_PROVIDER_SITE_OTHER): Payer: Medicaid Other

## 2014-12-30 DIAGNOSIS — O26841 Uterine size-date discrepancy, first trimester: Secondary | ICD-10-CM

## 2014-12-30 DIAGNOSIS — O3680X0 Pregnancy with inconclusive fetal viability, not applicable or unspecified: Secondary | ICD-10-CM

## 2014-12-30 NOTE — Progress Notes (Signed)
U/S-single IUP with +FCA noted, FHR-144 bpm, cx appears closed, bilateral adnexa appears WNL with C.L. Noted on Rt, CRL c/w 7+0 wks, (pt states that cycles were ~40 days) EDD 08/18/2015

## 2015-01-11 ENCOUNTER — Encounter: Payer: Self-pay | Admitting: Advanced Practice Midwife

## 2015-01-11 ENCOUNTER — Ambulatory Visit (INDEPENDENT_AMBULATORY_CARE_PROVIDER_SITE_OTHER): Payer: Medicaid Other | Admitting: Advanced Practice Midwife

## 2015-01-11 VITALS — BP 100/56 | HR 92 | Wt 177.0 lb

## 2015-01-11 DIAGNOSIS — Z3490 Encounter for supervision of normal pregnancy, unspecified, unspecified trimester: Secondary | ICD-10-CM | POA: Insufficient documentation

## 2015-01-11 DIAGNOSIS — Z0283 Encounter for blood-alcohol and blood-drug test: Secondary | ICD-10-CM

## 2015-01-11 DIAGNOSIS — Z114 Encounter for screening for human immunodeficiency virus [HIV]: Secondary | ICD-10-CM

## 2015-01-11 DIAGNOSIS — Z3682 Encounter for antenatal screening for nuchal translucency: Secondary | ICD-10-CM

## 2015-01-11 DIAGNOSIS — Z1159 Encounter for screening for other viral diseases: Secondary | ICD-10-CM

## 2015-01-11 DIAGNOSIS — Z113 Encounter for screening for infections with a predominantly sexual mode of transmission: Secondary | ICD-10-CM

## 2015-01-11 DIAGNOSIS — Z1389 Encounter for screening for other disorder: Secondary | ICD-10-CM

## 2015-01-11 DIAGNOSIS — Z331 Pregnant state, incidental: Secondary | ICD-10-CM

## 2015-01-11 DIAGNOSIS — Z118 Encounter for screening for other infectious and parasitic diseases: Secondary | ICD-10-CM

## 2015-01-11 DIAGNOSIS — Z130 Encounter for screening for diseases of the blood and blood-forming organs and certain disorders involving the immune mechanism: Secondary | ICD-10-CM

## 2015-01-11 DIAGNOSIS — Z3491 Encounter for supervision of normal pregnancy, unspecified, first trimester: Secondary | ICD-10-CM

## 2015-01-11 DIAGNOSIS — Z0184 Encounter for antibody response examination: Secondary | ICD-10-CM

## 2015-01-11 NOTE — Patient Instructions (Signed)
First Trimester of Pregnancy The first trimester of pregnancy is from week 1 until the end of week 12 (months 1 through 3). A week after a sperm fertilizes an egg, the egg will implant on the wall of the uterus. This embryo will begin to develop into a baby. Genes from you and your partner are forming the baby. The female genes determine whether the baby is a boy or a girl. At 6-8 weeks, the eyes and face are formed, and the heartbeat can be seen on ultrasound. At the end of 12 weeks, all the baby's organs are formed.  Now that you are pregnant, you will want to do everything you can to have a healthy baby. Two of the most important things are to get good prenatal care and to follow your health care provider's instructions. Prenatal care is all the medical care you receive before the baby's birth. This care will help prevent, find, and treat any problems during the pregnancy and childbirth. BODY CHANGES Your body goes through many changes during pregnancy. The changes vary from woman to woman.   You may gain or lose a couple of pounds at first.  You may feel sick to your stomach (nauseous) and throw up (vomit). If the vomiting is uncontrollable, call your health care provider.  You may tire easily.  You may develop headaches that can be relieved by medicines approved by your health care provider.  You may urinate more often. Painful urination may mean you have a bladder infection.  You may develop heartburn as a result of your pregnancy.  You may develop constipation because certain hormones are causing the muscles that push waste through your intestines to slow down.  You may develop hemorrhoids or swollen, bulging veins (varicose veins).  Your breasts may begin to grow larger and become tender. Your nipples may stick out more, and the tissue that surrounds them (areola) may become darker.  Your gums may bleed and may be sensitive to brushing and flossing.  Dark spots or blotches (chloasma,  mask of pregnancy) may develop on your face. This will likely fade after the baby is born.  Your menstrual periods will stop.  You may have a loss of appetite.  You may develop cravings for certain kinds of food.  You may have changes in your emotions from day to day, such as being excited to be pregnant or being concerned that something may go wrong with the pregnancy and baby.  You may have more vivid and strange dreams.  You may have changes in your hair. These can include thickening of your hair, rapid growth, and changes in texture. Some women also have hair loss during or after pregnancy, or hair that feels dry or thin. Your hair will most likely return to normal after your baby is born. WHAT TO EXPECT AT YOUR PRENATAL VISITS During a routine prenatal visit:  You will be weighed to make sure you and the baby are growing normally.  Your blood pressure will be taken.  Your abdomen will be measured to track your baby's growth.  The fetal heartbeat will be listened to starting around week 10 or 12 of your pregnancy.  Test results from any previous visits will be discussed. Your health care provider may ask you:  How you are feeling.  If you are feeling the baby move.  If you have had any abnormal symptoms, such as leaking fluid, bleeding, severe headaches, or abdominal cramping.  If you have any questions. Other tests   that may be performed during your first trimester include:  Blood tests to find your blood type and to check for the presence of any previous infections. They will also be used to check for low iron levels (anemia) and Rh antibodies. Later in the pregnancy, blood tests for diabetes will be done along with other tests if problems develop.  Urine tests to check for infections, diabetes, or protein in the urine.  An ultrasound to confirm the proper growth and development of the baby.  An amniocentesis to check for possible genetic problems.  Fetal screens for  spina bifida and Down syndrome.  You may need other tests to make sure you and the baby are doing well. HOME CARE INSTRUCTIONS  Medicines  Follow your health care provider's instructions regarding medicine use. Specific medicines may be either safe or unsafe to take during pregnancy.  Take your prenatal vitamins as directed.  If you develop constipation, try taking a stool softener if your health care provider approves. Diet  Eat regular, well-balanced meals. Choose a variety of foods, such as meat or vegetable-based protein, fish, milk and low-fat dairy products, vegetables, fruits, and whole grain breads and cereals. Your health care provider will help you determine the amount of weight gain that is right for you.  Avoid raw meat and uncooked cheese. These carry germs that can cause birth defects in the baby.  Eating four or five small meals rather than three large meals a day may help relieve nausea and vomiting. If you start to feel nauseous, eating a few soda crackers can be helpful. Drinking liquids between meals instead of during meals also seems to help nausea and vomiting.  If you develop constipation, eat more high-fiber foods, such as fresh vegetables or fruit and whole grains. Drink enough fluids to keep your urine clear or pale yellow. Activity and Exercise  Exercise only as directed by your health care provider. Exercising will help you:  Control your weight.  Stay in shape.  Be prepared for labor and delivery.  Experiencing pain or cramping in the lower abdomen or low back is a good sign that you should stop exercising. Check with your health care provider before continuing normal exercises.  Try to avoid standing for long periods of time. Move your legs often if you must stand in one place for a long time.  Avoid heavy lifting.  Wear low-heeled shoes, and practice good posture.  You may continue to have sex unless your health care provider directs you  otherwise. Relief of Pain or Discomfort  Wear a good support bra for breast tenderness.   Take warm sitz baths to soothe any pain or discomfort caused by hemorrhoids. Use hemorrhoid cream if your health care provider approves.   Rest with your legs elevated if you have leg cramps or low back pain.  If you develop varicose veins in your legs, wear support hose. Elevate your feet for 15 minutes, 3-4 times a day. Limit salt in your diet. Prenatal Care  Schedule your prenatal visits by the twelfth week of pregnancy. They are usually scheduled monthly at first, then more often in the last 2 months before delivery.  Write down your questions. Take them to your prenatal visits.  Keep all your prenatal visits as directed by your health care provider. Safety  Wear your seat belt at all times when driving.  Make a list of emergency phone numbers, including numbers for family, friends, the hospital, and police and fire departments. General Tips    Ask your health care provider for a referral to a local prenatal education class. Begin classes no later than at the beginning of month 6 of your pregnancy.  Ask for help if you have counseling or nutritional needs during pregnancy. Your health care provider can offer advice or refer you to specialists for help with various needs.  Do not use hot tubs, steam rooms, or saunas.  Do not douche or use tampons or scented sanitary pads.  Do not cross your legs for long periods of time.  Avoid cat litter boxes and soil used by cats. These carry germs that can cause birth defects in the baby and possibly loss of the fetus by miscarriage or stillbirth.  Avoid all smoking, herbs, alcohol, and medicines not prescribed by your health care provider. Chemicals in these affect the formation and growth of the baby.  Schedule a dentist appointment. At home, brush your teeth with a soft toothbrush and be gentle when you floss. SEEK MEDICAL CARE IF:   You have  dizziness.  You have mild pelvic cramps, pelvic pressure, or nagging pain in the abdominal area.  You have persistent nausea, vomiting, or diarrhea.  You have a bad smelling vaginal discharge.  You have pain with urination.  You notice increased swelling in your face, hands, legs, or ankles. SEEK IMMEDIATE MEDICAL CARE IF:   You have a fever.  You are leaking fluid from your vagina.  You have spotting or bleeding from your vagina.  You have severe abdominal cramping or pain.  You have rapid weight gain or loss.  You vomit blood or material that looks like coffee grounds.  You are exposed to German measles and have never had them.  You are exposed to fifth disease or chickenpox.  You develop a severe headache.  You have shortness of breath.  You have any kind of trauma, such as from a fall or a car accident. Document Released: 10/29/2001 Document Revised: 03/21/2014 Document Reviewed: 09/14/2013 [employer] Patient Information 2015 [employer]. This information is not intended to replace advice given to you by your health care provider. Make sure you discuss any questions you have with your health care provider.  

## 2015-01-11 NOTE — Progress Notes (Addendum)
  Subjective:    Ashlee Vincent is a [id number] [redacted]w[redacted]d being seen today for her first obstetrical visit.  Her obstetrical history is significant for 2 first trimester SAB's in the last 10 months.  Pregnancy history fully reviewed. She had an uncomplicated SVD age 19.                                                                                                 Patient reports RLQ pain.  Has a know CL cyst on right ovary.Some mild nausea, declines meds.  Denies UTI sx Filed Vitals:   01/11/15 1444  BP: 100/56  Pulse: 92  Weight: 177 lb (80.287 kg)   Urine + Nitrites  HISTORY: OB History  Gravida Para Term Preterm AB SAB TAB Ectopic Multiple Living  4 1 1  0 2 2 0 0 0 1    # Outcome Date GA Lbr Len/2nd Weight Sex Delivery Anes PTL Lv  4 Current           3 SAB 06/27/14 [redacted]w[redacted]d            Comments: System Generated. Please review and update pregnancy details.  2 SAB 02/16/14          1 Term 02/21/10 [redacted]w[redacted]d  7 lb 14 oz (3.572 kg) F Vag-Spont None N Y     Past Medical History  Diagnosis Date  . Medical history non-contributory    Past Surgical History  Procedure Laterality Date  . No past surgeries     Family History  Problem Relation Age of Onset  . Diabetes Maternal Grandmother   . Diabetes Paternal Grandmother      Exam                                           Skin: normal coloration and turgor, no rashes    Neurologic: oriented, normal, normal mood   Extremities: normal strength, tone, and muscle mass   HEENT PERRLA   Mouth/Teeth mucous membranes moist, normla dentition   Neck supple and no masses   Cardiovascular: regular rate and rhythm   Respiratory:  appears well, vitals normal, no respiratory distress, acyanotic   Abdomen: soft, non-tender;  FHR: 160us          Assessment:    Pregnancy: [id number] Patient Active Problem List   Diagnosis Date Noted  . Supervision of normal pregnancy 01/11/2015  . Miscarriage 06/27/2014  . UTI (urinary tract  infection) in pregnancy in first trimester 06/21/2014  . Miscarriage 03/14/2014        Plan:     Initial labs drawn. Continue prenatal vitamins  Rx Macrobid 100 mg BID X7 and culture urine Problem list reviewed and updated  Reviewed n/v relief measures and warning s/s to report  Reviewed recommended weight gain based on pre-gravid BMI  Encouraged well-balanced diet Genetic Screening discussed Integrated Screen: requested.  Ultrasound discussed; fetal survey: requested.  Follow up in 4 weeks for NT/IT.  CRESENZO-DISHMAN,Soma Lizak 01/11/2015

## 2015-01-11 NOTE — Addendum Note (Signed)
Addended by: Traci Sermon A on: 01/11/2015 04:02 PM   Modules accepted: Orders

## 2015-01-12 LAB — GC/CHLAMYDIA PROBE AMP
Chlamydia trachomatis, NAA: NEGATIVE
Neisseria gonorrhoeae by PCR: NEGATIVE

## 2015-01-12 LAB — URINALYSIS, ROUTINE W REFLEX MICROSCOPIC
Bilirubin, UA: NEGATIVE
Glucose, UA: NEGATIVE
LEUKOCYTES UA: NEGATIVE
Nitrite, UA: POSITIVE — AB
PH UA: 6.5 (ref 5.0–7.5)
Protein, UA: NEGATIVE
RBC, UA: NEGATIVE
SPEC GRAV UA: 1.022 (ref 1.005–1.030)
Urobilinogen, Ur: 0.2 mg/dL (ref 0.2–1.0)

## 2015-01-12 LAB — PMP SCREEN PROFILE (10S), URINE
Amphetamine Screen, Ur: NEGATIVE ng/mL
BARBITURATE SCRN UR: NEGATIVE ng/mL
Benzodiazepine Screen, Urine: NEGATIVE ng/mL
CANNABINOIDS UR QL SCN: NEGATIVE ng/mL
COCAINE(METAB.) SCREEN, URINE: NEGATIVE ng/mL
CREATININE(CRT), U: 141.1 mg/dL (ref 20.0–300.0)
METHADONE SCREEN, URINE: NEGATIVE ng/mL
Opiate Scrn, Ur: NEGATIVE ng/mL
Oxycodone+Oxymorphone Ur Ql Scn: NEGATIVE ng/mL
PCP SCRN UR: NEGATIVE ng/mL
PH UR, DRUG SCRN: 6.4 (ref 4.5–8.9)
Propoxyphene, Screen: NEGATIVE ng/mL

## 2015-01-12 LAB — ABO/RH: Rh Factor: POSITIVE

## 2015-01-12 LAB — CBC
HEMATOCRIT: 40.2 % (ref 34.0–46.6)
Hemoglobin: 14 g/dL (ref 11.1–15.9)
MCH: 32.2 pg (ref 26.6–33.0)
MCHC: 34.8 g/dL (ref 31.5–35.7)
MCV: 92 fL (ref 79–97)
Platelets: 304 10*3/uL (ref 150–379)
RBC: 4.35 x10E6/uL (ref 3.77–5.28)
RDW: 14.1 % (ref 12.3–15.4)
WBC: 10 10*3/uL (ref 3.4–10.8)

## 2015-01-12 LAB — MICROSCOPIC EXAMINATION: Casts: NONE SEEN /lpf

## 2015-01-12 LAB — VARICELLA ZOSTER ANTIBODY, IGG: Varicella zoster IgG: 3683 index (ref >165)

## 2015-01-12 LAB — SICKLE CELL SCREEN: Sickle Cell Screen: NEGATIVE

## 2015-01-12 LAB — HEPATITIS B SURFACE ANTIGEN: HEP B S AG: NEGATIVE

## 2015-01-12 LAB — HIV ANTIBODY (ROUTINE TESTING W REFLEX): HIV Screen 4th Generation wRfx: NONREACTIVE

## 2015-01-12 LAB — RPR: RPR Ser Ql: NONREACTIVE

## 2015-01-12 LAB — RUBELLA SCREEN: RUBELLA: 2.35 {index} (ref >0.99)

## 2015-01-12 LAB — ANTIBODY SCREEN: Antibody Screen: NEGATIVE

## 2015-01-12 MED ORDER — NITROFURANTOIN MONOHYD MACRO 100 MG PO CAPS: 100.0000 mg | ORAL_CAPSULE | Freq: Two times a day (BID) | ORAL | Status: AC

## 2015-01-12 NOTE — Addendum Note (Signed)
Addended by: Christin Fudge on: 01/12/2015 10:26 AM   Modules accepted: Orders

## 2015-01-13 LAB — URINE CULTURE

## 2015-02-01 ENCOUNTER — Encounter: Payer: Self-pay | Admitting: Obstetrics and Gynecology

## 2015-02-01 ENCOUNTER — Ambulatory Visit (INDEPENDENT_AMBULATORY_CARE_PROVIDER_SITE_OTHER): Payer: Medicaid Other | Admitting: Obstetrics and Gynecology

## 2015-02-01 ENCOUNTER — Ambulatory Visit (INDEPENDENT_AMBULATORY_CARE_PROVIDER_SITE_OTHER): Payer: Medicaid Other

## 2015-02-01 VITALS — BP 100/60 | HR 78 | Wt 171.0 lb

## 2015-02-01 DIAGNOSIS — Z3682 Encounter for antenatal screening for nuchal translucency: Secondary | ICD-10-CM

## 2015-02-01 DIAGNOSIS — Z3491 Encounter for supervision of normal pregnancy, unspecified, first trimester: Secondary | ICD-10-CM

## 2015-02-01 DIAGNOSIS — Z36 Encounter for antenatal screening of mother: Secondary | ICD-10-CM

## 2015-02-01 DIAGNOSIS — Z1389 Encounter for screening for other disorder: Secondary | ICD-10-CM

## 2015-02-01 DIAGNOSIS — Z331 Pregnant state, incidental: Secondary | ICD-10-CM

## 2015-02-01 LAB — POCT URINALYSIS DIPSTICK
GLUCOSE UA: NEGATIVE
Leukocytes, UA: NEGATIVE
Nitrite, UA: NEGATIVE
PROTEIN UA: NEGATIVE
RBC UA: NEGATIVE

## 2015-02-01 NOTE — Progress Notes (Signed)
Pt denies any problems or concerns at this time.  

## 2015-02-01 NOTE — Progress Notes (Signed)
Patient ID: Ashlee Vincent, female   DOB: 1996/07/12, 19 y.o.   MRN: [id number]  This chart was SCRIBED for Ashlee Shirk, MD by Stephania Fragmin, ED Scribe. This patient was seen in room 1 and the patient's care was started at 3:59 PM.   [id number] [redacted]w[redacted]d Estimated Date of Delivery: 08/18/15  Blood pressure 100/60, pulse 78, weight 171 lb (77.565 kg), last menstrual period 11/03/2014.   refer to the ob flow sheet for FH and FHR, also BP, Wt, Urine results:notable for n/a  Patient reports  Too early for good fetal movement, denies any bleeding and no rupture of membranes symptoms or regular contractions. Patient complaints:  Patient states she is concerned because she originally weighed 180 pounds, but has gone down to 171 pounds today. Pt voiding well, hydrated. Pt reassured.  Questions were answered. Assessment:  Plan:  Continued routine obstetrical care,  F/u in 4 weeks for prenatal visit and second nt draw.  I personally performed the services described in this documentation, which was SCRIBED in my presence. The recorded information has been reviewed and considered accurate. It has been edited as necessary during review. Jonnie Kind, MD

## 2015-02-01 NOTE — Progress Notes (Signed)
NT [place] today. Active, single fetus with FHR 158 bpm.  CRL 59.8mm which is consistent with dating.  NT measures 1.90mm.  Nasal bone area not visualized today due to fetal position.  Bilateral ovaries are within normal limits.

## 2015-02-03 ENCOUNTER — Emergency Department (HOSPITAL_COMMUNITY)
Admission: EM | Admit: 2015-02-03 | Discharge: 2015-02-03 | Disposition: A | Discharge status: Home / Self Care | Payer: Self-pay | Attending: Emergency Medicine | Admitting: Emergency Medicine

## 2015-02-03 ENCOUNTER — Encounter (HOSPITAL_COMMUNITY): Payer: Self-pay | Admitting: *Deleted

## 2015-02-03 DIAGNOSIS — K625 Hemorrhage of anus and rectum: Secondary | ICD-10-CM | POA: Insufficient documentation

## 2015-02-03 LAB — MATERNAL SCREEN, INTEGRATED #1
CROWN RUMP LENGTH MAT SCREEN: 59.3 mm
Gest. Age on Collection Date: 12.4 weeks
Maternal Age at EDD: 19.8 years
Nuchal Translucency (NT): 1.4 mm
Number of Fetuses: 1
PAPP-A VALUE: 499.7 ng/mL
Weight: 171 [lb_av]

## 2015-02-03 MED ORDER — HYDROCORTISONE ACETATE 25 MG RE SUPP
25.0000 mg | Freq: Two times a day (BID) | RECTAL | Status: DC
Start: 2015-02-03 — End: 2015-06-01

## 2015-02-03 NOTE — ED Notes (Signed)
Noticed bright red blood in stool this morning. Stools are loose. Denies dizziness or lightheadeness.  Having mild pelvic cramping.

## 2015-02-03 NOTE — ED Provider Notes (Signed)
CSN: [id number]     Arrival date & time 02/03/15  1508 History   First MD Initiated Contact with Patient 02/03/15 1618     Chief Complaint  Patient presents with  . Blood In Stools     (Consider location/radiation/quality/duration/timing/severity/associated sxs/prior Treatment) HPI.... Patient complains of minimal amount of red blood in her stool today. Also blood was noted on the toilet paper. Last episode was 4 hours ago. Total blood loss was very small. This is never happened before. No history of intestinal bleeding. She is not weak or syncopal. Patient is 3 months pregnant. No vaginal bleeding or discharge.  Past Medical History  Diagnosis Date  . Medical history non-contributory    Past Surgical History  Procedure Laterality Date  . No past surgeries     Family History  Problem Relation Age of Onset  . Diabetes Maternal Grandmother   . Diabetes Paternal Grandmother    History  Substance Use Topics  . Smoking status: Never Smoker   . Smokeless tobacco: Never Used  . Alcohol Use: No   OB History    Gravida Para Term Preterm AB TAB SAB Ectopic Multiple Living   4 1 1  0 2 0 2 0 0 1     Review of Systems  All other systems reviewed and are negative.     Allergies  Review of patient's allergies indicates no known allergies.  Home Medications   Prior to Admission medications   Medication Sig Start Date End Date Taking? Authorizing Provider  hydrocortisone (ANUSOL-HC) 25 MG suppository Place 1 suppository (25 mg total) rectally 2 (two) times daily. For 7 days 02/03/15   Nat Christen, MD   BP 115/65 mmHg  Pulse 73  Temp(Src) 98.7 F (37.1 C) (Oral)  Resp 18  Ht 4\' 11"  (1.499 m)  Wt 171 lb (77.565 kg)  BMI 34.52 kg/m2  SpO2 99%  LMP 11/03/2014 Physical Exam  Constitutional: She is oriented to person, place, and time. She appears well-developed and well-nourished.  HENT:  Head: Normocephalic and atraumatic.  Eyes: Conjunctivae and EOM are normal. Pupils are  equal, round, and reactive to light.  Neck: Normal range of motion. Neck supple.  Cardiovascular: Normal rate and regular rhythm.   Pulmonary/Chest: Effort normal and breath sounds normal.  Abdominal: Soft. Bowel sounds are normal.  Genitourinary:  Rectal exam: No masses, heme-negative  Musculoskeletal: Normal range of motion.  Neurological: She is alert and oriented to person, place, and time.  Skin: Skin is warm and dry.  Psychiatric: She has a normal mood and affect. Her behavior is normal.  Nursing note and vitals reviewed.   ED Course  Procedures (including critical care time) Labs Review Labs Reviewed - No data to display  Imaging Review No results found.   EKG Interpretation None      MDM   Final diagnoses:  Rectal bleeding    Patient is hemodynamically stable. She is alert and pleasant. Normal color. Rectal exam negative for blood or masses. Discharge medications Anusol suppository    Nat Christen, MD 02/03/15 1715

## 2015-02-03 NOTE — Discharge Instructions (Signed)
Rectal exam showed no blood. Use the suppository if bleeding returns. Return here if worse.

## 2015-03-01 ENCOUNTER — Ambulatory Visit (INDEPENDENT_AMBULATORY_CARE_PROVIDER_SITE_OTHER): Payer: Self-pay | Admitting: Women's Health

## 2015-03-01 ENCOUNTER — Encounter: Payer: Self-pay | Admitting: Women's Health

## 2015-03-01 VITALS — BP 100/52 | HR 64 | Wt 168.0 lb

## 2015-03-01 DIAGNOSIS — Z36 Encounter for antenatal screening of mother: Secondary | ICD-10-CM

## 2015-03-01 DIAGNOSIS — Z331 Pregnant state, incidental: Secondary | ICD-10-CM

## 2015-03-01 DIAGNOSIS — Z3492 Encounter for supervision of normal pregnancy, unspecified, second trimester: Secondary | ICD-10-CM

## 2015-03-01 DIAGNOSIS — Z3682 Encounter for antenatal screening for nuchal translucency: Secondary | ICD-10-CM

## 2015-03-01 DIAGNOSIS — Z363 Encounter for antenatal screening for malformations: Secondary | ICD-10-CM

## 2015-03-01 DIAGNOSIS — Z1389 Encounter for screening for other disorder: Secondary | ICD-10-CM

## 2015-03-01 LAB — POCT URINALYSIS DIPSTICK
GLUCOSE UA: NEGATIVE
Leukocytes, UA: NEGATIVE
Nitrite, UA: NEGATIVE
PROTEIN UA: NEGATIVE
RBC UA: NEGATIVE

## 2015-03-01 NOTE — Progress Notes (Addendum)
Low-risk OB appointment [id number] [redacted]w[redacted]d Estimated Date of Delivery: 08/18/15 BP 100/52 mmHg  Pulse 64  Wt 168 lb (76.204 kg)  LMP 11/03/2014  BP, weight, and urine reviewed.  Refer to obstetrical flow sheet for FH & FHR.  No fm yet. Denies cramping, lof, vb, or uti s/s. Feels dizzy/lightheaded at times, discussed possibly from low bp or low sugar- discussed prevention & relief measures.  Reviewed warning s/s to report. Plan:  Continue routine obstetrical care  F/U in 4wks for OB appointment and anatomy u/s 2nd IT today

## 2015-03-01 NOTE — Patient Instructions (Addendum)
For Dizzy Spells:   This is usually related to either your blood sugar or your blood pressure dropping  Make sure you are staying well hydrated and drinking enough water so that your urine is clear  Eat small frequent meals and snacks containing protein (meat, eggs, nuts, cheese) so that your blood sugar doesn't drop  If you do get dizzy, sit/lay down and get you something to drink and a snack containing protein- you will usually start feeling better in 10-20 minutes    Second Trimester of Pregnancy The second trimester is from week 13 through week 28, months 4 through 6. The second trimester is often a time when you feel your best. Your body has also adjusted to being pregnant, and you begin to feel better physically. Usually, morning sickness has lessened or quit completely, you may have more energy, and you may have an increase in appetite. The second trimester is also a time when the fetus is growing rapidly. At the end of the sixth month, the fetus is about 9 inches long and weighs about 1 pounds. You will likely begin to feel the baby move (quickening) between 18 and 20 weeks of the pregnancy. BODY CHANGES Your body goes through many changes during pregnancy. The changes vary from woman to woman.  5. Your weight will continue to increase. You will notice your lower abdomen bulging out. 6. You may begin to get stretch marks on your hips, abdomen, and breasts. 7. You may develop headaches that can be relieved by medicines approved by your health care provider. 8. You may urinate more often because the fetus is pressing on your bladder. 9. You may develop or continue to have heartburn as a result of your pregnancy. 10. You may develop constipation because certain hormones are causing the muscles that push waste through your intestines to slow down. 11. You may develop hemorrhoids or swollen, bulging veins (varicose veins). 12. You may have back pain because of the weight gain and pregnancy  hormones relaxing your joints between the bones in your pelvis and as a result of a shift in weight and the muscles that support your balance. 13. Your breasts will continue to grow and be tender. 14. Your gums may bleed and may be sensitive to brushing and flossing. 15. Dark spots or blotches (chloasma, mask of pregnancy) may develop on your face. This will likely fade after the baby is born. 16. A dark line from your belly button to the pubic area (linea nigra) may appear. This will likely fade after the baby is born. 17. You may have changes in your hair. These can include thickening of your hair, rapid growth, and changes in texture. Some women also have hair loss during or after pregnancy, or hair that feels dry or thin. Your hair will most likely return to normal after your baby is born. WHAT TO EXPECT AT YOUR PRENATAL VISITS During a routine prenatal visit:  You will be weighed to make sure you and the fetus are growing normally.  Your blood pressure will be taken.  Your abdomen will be measured to track your baby's growth.  The fetal heartbeat will be listened to.  Any test results from the previous visit will be discussed. Your health care provider may ask you:  How you are feeling.  If you are feeling the baby move.  If you have had any abnormal symptoms, such as leaking fluid, bleeding, severe headaches, or abdominal cramping.  If you have any questions. Other  tests that may be performed during your second trimester include:  Blood tests that check for:  Low iron levels (anemia).  Gestational diabetes (between 24 and 28 weeks).  Rh antibodies.  Urine tests to check for infections, diabetes, or protein in the urine.  An ultrasound to confirm the proper growth and development of the baby.  An amniocentesis to check for possible genetic problems.  Fetal screens for spina bifida and Down syndrome. HOME CARE INSTRUCTIONS   Avoid all smoking, herbs, alcohol, and  unprescribed drugs. These chemicals affect the formation and growth of the baby.  Follow your health care provider's instructions regarding medicine use. There are medicines that are either safe or unsafe to take during pregnancy.  Exercise only as directed by your health care provider. Experiencing uterine cramps is a good sign to stop exercising.  Continue to eat regular, healthy meals.  Wear a good support bra for breast tenderness.  Do not use hot tubs, steam rooms, or saunas.  Wear your seat belt at all times when driving.  Avoid raw meat, uncooked cheese, cat litter boxes, and soil used by cats. These carry germs that can cause birth defects in the baby.  Take your prenatal vitamins.  Try taking a stool softener (if your health care provider approves) if you develop constipation. Eat more high-fiber foods, such as fresh vegetables or fruit and whole grains. Drink plenty of fluids to keep your urine clear or pale yellow.  Take warm sitz baths to soothe any pain or discomfort caused by hemorrhoids. Use hemorrhoid cream if your health care provider approves.  If you develop varicose veins, wear support hose. Elevate your feet for 15 minutes, 3-4 times a day. Limit salt in your diet.  Avoid heavy lifting, wear low heel shoes, and practice good posture.  Rest with your legs elevated if you have leg cramps or low back pain.  Visit your dentist if you have not gone yet during your pregnancy. Use a soft toothbrush to brush your teeth and be gentle when you floss.  A sexual relationship may be continued unless your health care provider directs you otherwise.  Continue to go to all your prenatal visits as directed by your health care provider. SEEK MEDICAL CARE IF:   You have dizziness.  You have mild pelvic cramps, pelvic pressure, or nagging pain in the abdominal area.  You have persistent nausea, vomiting, or diarrhea.  You have a bad smelling vaginal discharge.  You have  pain with urination. SEEK IMMEDIATE MEDICAL CARE IF:   You have a fever.  You are leaking fluid from your vagina.  You have spotting or bleeding from your vagina.  You have severe abdominal cramping or pain.  You have rapid weight gain or loss.  You have shortness of breath with chest pain.  You notice sudden or extreme swelling of your face, hands, ankles, feet, or legs.  You have not felt your baby move in over an hour.  You have severe headaches that do not go away with medicine.  You have vision changes. Document Released: 10/29/2001 Document Revised: 11/09/2013 Document Reviewed: 01/05/2013 [facility] Patient Information 2015 [place], [place]. This information is not intended to replace advice given to you by your health care provider. Make sure you discuss any questions you have with your health care provider.

## 2015-03-03 LAB — MATERNAL SCREEN, INTEGRATED #2
ADSF: 0.8
AFP MoM: 1.57
Alpha-Fetoprotein: 48.3 ng/mL
Crown Rump Length: 59.3 mm
DIA MoM: 0.65
DIA VALUE: 110 pg/mL
Estriol, Unconjugated: 0.72 ng/mL
Gest. Age on Collection Date: 12.4 weeks
Gestational Age: 16.4 weeks
HCG MOM: 1.73
HCG VALUE: 51.2 [IU]/mL
MATERNAL AGE AT EDD: 19.8 a
NUMBER OF FETUSES: 1
Nuchal Translucency (NT): 1.4 mm
Nuchal Translucency MoM: 0.97
PAPP-A MoM: 0.61
PAPP-A Value: 499.7 ng/mL
Test Results:: NEGATIVE
WEIGHT: 171 [lb_av]
Weight: 168 [lb_av]

## 2015-03-09 ENCOUNTER — Ambulatory Visit (INDEPENDENT_AMBULATORY_CARE_PROVIDER_SITE_OTHER): Payer: Self-pay | Admitting: Advanced Practice Midwife

## 2015-03-09 VITALS — BP 108/60 | HR 76 | Wt 172.0 lb

## 2015-03-09 DIAGNOSIS — Z3492 Encounter for supervision of normal pregnancy, unspecified, second trimester: Secondary | ICD-10-CM

## 2015-03-09 DIAGNOSIS — Z1389 Encounter for screening for other disorder: Secondary | ICD-10-CM

## 2015-03-09 DIAGNOSIS — Z331 Pregnant state, incidental: Secondary | ICD-10-CM

## 2015-03-09 LAB — POCT URINALYSIS DIPSTICK
Glucose, UA: NEGATIVE
Ketones, UA: NEGATIVE
LEUKOCYTES UA: NEGATIVE
NITRITE UA: NEGATIVE
Protein, UA: NEGATIVE

## 2015-03-09 MED ORDER — FLUCONAZOLE 150 MG PO TABS: ORAL_TABLET | ORAL | Status: DC

## 2015-03-09 NOTE — Progress Notes (Signed)
WI for 2 days of vaginal itch with white discharge.  Daughter has cold, started sniffiling yesterday.  SSE:  Thick white dc cw yeast.  Rx diflucan.  May get zinc to help ward off cold.

## 2015-03-28 ENCOUNTER — Other Ambulatory Visit: Payer: Self-pay | Admitting: Women's Health

## 2015-03-28 DIAGNOSIS — O09299 Supervision of pregnancy with other poor reproductive or obstetric history, unspecified trimester: Secondary | ICD-10-CM

## 2015-03-28 DIAGNOSIS — Z363 Encounter for antenatal screening for malformations: Secondary | ICD-10-CM

## 2015-03-29 ENCOUNTER — Encounter: Payer: Self-pay | Admitting: Advanced Practice Midwife

## 2015-03-29 ENCOUNTER — Ambulatory Visit (INDEPENDENT_AMBULATORY_CARE_PROVIDER_SITE_OTHER): Payer: Self-pay | Admitting: Advanced Practice Midwife

## 2015-03-29 ENCOUNTER — Ambulatory Visit (INDEPENDENT_AMBULATORY_CARE_PROVIDER_SITE_OTHER): Payer: Self-pay

## 2015-03-29 VITALS — BP 92/56 | HR 68 | Wt 170.0 lb

## 2015-03-29 DIAGNOSIS — Z3492 Encounter for supervision of normal pregnancy, unspecified, second trimester: Secondary | ICD-10-CM

## 2015-03-29 DIAGNOSIS — Z363 Encounter for antenatal screening for malformations: Secondary | ICD-10-CM

## 2015-03-29 DIAGNOSIS — Z331 Pregnant state, incidental: Secondary | ICD-10-CM

## 2015-03-29 DIAGNOSIS — Z1389 Encounter for screening for other disorder: Secondary | ICD-10-CM

## 2015-03-29 DIAGNOSIS — Z36 Encounter for antenatal screening of mother: Secondary | ICD-10-CM

## 2015-03-29 DIAGNOSIS — O09299 Supervision of pregnancy with other poor reproductive or obstetric history, unspecified trimester: Secondary | ICD-10-CM

## 2015-03-29 DIAGNOSIS — O09291 Supervision of pregnancy with other poor reproductive or obstetric history, first trimester: Secondary | ICD-10-CM

## 2015-03-29 LAB — POCT URINALYSIS DIPSTICK
Glucose, UA: NEGATIVE
KETONES UA: NEGATIVE
Leukocytes, UA: NEGATIVE
Nitrite, UA: NEGATIVE
PROTEIN UA: NEGATIVE
RBC UA: NEGATIVE

## 2015-03-29 NOTE — Progress Notes (Signed)
[id number] [redacted]w[redacted]d Estimated Date of Delivery: 08/18/15  Blood pressure 92/56, pulse 68, weight 170 lb (77.111 kg), last menstrual period 11/03/2014.   BP weight and urine results all reviewed and noted.  Please refer to the obstetrical flow sheet for the fundal height and fetal heart rate documentation:had anatomy scan today (see below)  Patient reports good fetal movement, denies any bleeding and no rupture of membranes symptoms or regular contractions. Patient is without complaints. All questions were answered.  Plan:  Continued routine obstetrical care,   Follow up in 4 weeks for OB appointment,

## 2015-03-29 NOTE — Progress Notes (Signed)
[place] 19+5WKS measurements c/w dates, ant pl gr 0 ,cx 3.6cm,afi sdp 5.25 cm,efw 362,normal ov's bilat

## 2015-04-14 ENCOUNTER — Telehealth: Payer: Self-pay | Admitting: *Deleted

## 2015-04-14 MED ORDER — FLUCONAZOLE 150 MG PO TABS: ORAL_TABLET | ORAL | Status: DC

## 2015-04-14 NOTE — Telephone Encounter (Signed)
Will rx diflucan  

## 2015-04-14 NOTE — Telephone Encounter (Signed)
Pt states she has a yeast infection again, OTC medication not helping. Mylo Red, CNM treated for yeast in April.

## 2015-04-26 ENCOUNTER — Ambulatory Visit (INDEPENDENT_AMBULATORY_CARE_PROVIDER_SITE_OTHER): Payer: Self-pay | Admitting: Advanced Practice Midwife

## 2015-04-26 VITALS — BP 84/42 | HR 90 | Wt 174.0 lb

## 2015-04-26 DIAGNOSIS — Z3492 Encounter for supervision of normal pregnancy, unspecified, second trimester: Secondary | ICD-10-CM

## 2015-04-26 DIAGNOSIS — Z1389 Encounter for screening for other disorder: Secondary | ICD-10-CM

## 2015-04-26 DIAGNOSIS — Z331 Pregnant state, incidental: Secondary | ICD-10-CM

## 2015-04-26 LAB — POCT URINALYSIS DIPSTICK
Glucose, UA: NEGATIVE
KETONES UA: NEGATIVE
Leukocytes, UA: NEGATIVE
Nitrite, UA: NEGATIVE
RBC UA: NEGATIVE

## 2015-04-26 NOTE — Progress Notes (Signed)
[id number] [redacted]w[redacted]d Estimated Date of Delivery: 08/18/15  Blood pressure 84/42, pulse 90, weight 174 lb (78.926 kg), last menstrual period 11/03/2014.   BP weight and urine results all reviewed and noted.  Please refer to the obstetrical flow sheet for the fundal height and fetal heart rate documentation:  Patient reports good fetal movement, denies any bleeding and no rupture of membranes symptoms or regular contractions. Patient is without complaints other than normal pregnancy complaints All questions were answered.  Plan:  Continued routine obstetrical care, given info to help with hypotension  Follow up in 4 weeks for OB appointment, PN2

## 2015-04-26 NOTE — Patient Instructions (Addendum)
1. Before your test, do not eat or drink anything for 8-10 hours prior to your  appointment (a small amount of water is allowed and you may take any medicines you normally take). Be sure to drink lots of water the day before. 2. When you arrive, your blood will be drawn for a 'fasting' blood sugar level.  Then you will be given a sweetened carbonated beverage to drink. You should  complete drinking this beverage within five minutes. After finishing the  beverage, you will have your blood drawn exactly 1 and 2 hours later. Having  your blood drawn on time is an important part of this test. A total of three blood  samples will be done. 3. The test takes approximately 2  hours. During the test, do not have anything to  eat or drink. Do not smoke, chew gum (not even sugarless gum) or use breath mints.  4. During the test you should remain close by and seated as much as possible and  avoid walking around. You may want to bring a book or something else to  occupy your time.  5. After your test, you may eat and drink as normal. You may want to bring a snack  to eat after the test is finished. Your provider will advise you as to the results of  this test and any follow-up if necessary  You will also be retested for syphilis, HIV and blood levels (anemia):  You were already tested in the first trimester, but [place] recommends retesting.  Additionally, you will be tested for Type 2 Herpes. MOST people do not know that they have genital herpes, as only around 15% of people have outbreaks.  However, it is still transmittable to other people, including the baby (but only during the birth).  If you test positive for Type 2 Herpes, we place you on a medicine called acyclovir the last 6 weeks of your pregnancy to prevent transmission of the virus to the baby during the birth.    If your sugar test is positive for gestational diabetes, you will be given an phone call and further instructions discussed.   We typically do not call patients with positive herpes results, but will discuss it at your next appointment.  If you wish to know all of your test results before your next appointment, feel free to call the office, or look up your test results on Mychart.  (The range that the lab uses for normal values of the sugar test are not necessarily the range that is used for pregnant women; if your results are within the range, they are definitely normal.  However, if a value is deemed "high" by the lab, it may not be too high for a pregnant woman.  We will need to discuss the normal range if your value(s) fall in the "high" category).     Tdap Vaccine  It is recommended that you get the Tdap vaccine during the third trimester of EACH pregnancy to help protect your baby from getting pertussis (whooping cough)  27-36 weeks is the BEST time to do this so that you can pass the protection on to your baby. During pregnancy is better than after pregnancy, but if you are unable to get it during pregnancy it will be offered at the hospital.  You can get this vaccine at the health department or your family doctor, as well as some pharmacies.  Everyone who will be around your baby should also be up-to-date on  their vaccines. Adults (who are not pregnant) only need 1 dose of Tdap during adulthood.     Hypotension As your heart beats, it forces blood through your arteries. This force is your blood pressure. If your blood pressure is too low for you to go about your normal activities or to support the organs of your body, you have hypotension. Hypotension is also referred to as low blood pressure. When your blood pressure becomes too low, you may not get enough blood to your brain. As a result, you may feel weak, feel lightheaded, or develop a rapid heart rate. In a more severe case, you may faint. CAUSES Various conditions can cause hypotension. These include:  Blood loss.  Dehydration.  Heart or endocrine  problems.  Pregnancy.  Severe infection.  Not having a well-balanced diet filled with needed nutrients.  Severe allergic reactions (anaphylaxis). Some medicines, such as blood pressure medicine or water pills (diuretics), may lower your blood pressure below normal. Sometimes taking too much medicine or taking medicine not as directed can cause hypotension. TREATMENT  Hospitalization is sometimes required for hypotension if fluid or blood replacement is needed, if time is needed for medicines to wear off, or if further monitoring is needed. Treatment might include changing your diet, changing your medicines (including medicines aimed at raising your blood pressure), and use of support stockings. HOME CARE INSTRUCTIONS   Drink enough fluids to keep your urine clear or pale yellow.  Take your medicines as directed by your health care provider.  Get up slowly from reclining or sitting positions. This gives your blood pressure a chance to adjust.  Wear support stockings as directed by your health care provider.  Maintain a healthy diet by including nutritious food, such as fruits, vegetables, nuts, whole grains, and lean meats. SEEK MEDICAL CARE IF:  You have vomiting or diarrhea.  You have a fever for more than 2-3 days.  You feel more thirsty than usual.  You feel weak and tired. SEEK IMMEDIATE MEDICAL CARE IF:   You have chest pain or a fast or irregular heartbeat.  You have a loss of feeling in some part of your body, or you lose movement in your arms or legs.  You have trouble speaking.  You become sweaty or feel lightheaded.  You faint. MAKE SURE YOU:   Understand these instructions.  Will watch your condition.  Will get help right away if you are not doing well or get worse. Document Released: 11/04/2005 Document Revised: 08/25/2013 Document Reviewed: 05/07/2013 [facility] Patient Information 2015 [place], [place]. This information is not intended to replace advice  given to you by your health care provider. Make sure you discuss any questions you have with your health care provider.    Round Ligament Pain During Pregnancy Round ligament pain is a sharp pain or jabbing feeling often felt in the lower belly or groin area on one or both sides. It is one of the most common complaints during pregnancy and is considered a normal part of pregnancy. It is most often felt during the second trimester.  Here is what you need to know about round ligament pain, including some tips to help you feel better.  Causes of Round Ligament Pain  Several thick ligaments surround and support your womb (uterus) as it grows during pregnancy. One of them is called the round ligament.  The round ligament connects the front part of the womb to your groin, the area where your legs attach to your pelvis. The  round ligament normally tightens and relaxes slowly.  As your baby and womb grow, the round ligament stretches. That makes it more likely to become strained.  Sudden movements can cause the ligament to tighten quickly, like a rubber band snapping. This causes a sudden and quick jabbing feeling.  Symptoms of Round Ligament Pain  Round ligament pain can be concerning and uncomfortable. But it is considered normal as your body changes during pregnancy.  The symptoms of round ligament pain include a sharp, sudden spasm in the belly. It usually affects the right side, but it may happen on both sides. The pain only lasts a few seconds.  Exercise may cause the pain, as will rapid movements such as:  sneezing coughing laughing rolling over in bed standing up too quickly  Treatment of Round Ligament Pain  Here are some tips that may help reduce your discomfort:  Pain relief. Take over-the-counter acetaminophen for pain, if necessary. Ask your doctor if this is OK.  Exercise. Get plenty of exercise to keep your stomach (core) muscles strong. Doing stretching exercises or  prenatal yoga can be helpful. Ask your doctor which exercises are safe for you and your baby.  A helpful exercise involves putting your hands and knees on the floor, lowering your head, and pushing your backside into the air.  Avoid sudden movements. Change positions slowly (such as standing up or sitting down) to avoid sudden movements that may cause stretching and pain.  Flex your hips. Bend and flex your hips before you cough, sneeze, or laugh to avoid pulling on the ligaments.  Apply warmth. A heating pad or warm bath may be helpful. Ask your doctor if this is OK. Extreme heat can be dangerous to the baby.  You should try to modify your daily activity level and avoid positions that may worsen the condition.  When to Call the Doctor/Midwife  Always tell your doctor or midwife about any type of pain you have during pregnancy. Round ligament pain is quick and doesn't last long.  Call your health care provider immediately if you have:  severe pain fever chills pain on urination difficulty walking  Belly pain during pregnancy can be due to many different causes. It is important for your doctor to rule out more serious conditions, including pregnancy complications such as placenta abruption or non-pregnancy illnesses such as:  inguinal hernia appendicitis stomach, liver, and kidney problems Preterm labor pains may sometimes be mistaken for round ligament pain.

## 2015-05-24 ENCOUNTER — Other Ambulatory Visit: Payer: Self-pay

## 2015-05-24 ENCOUNTER — Encounter: Payer: Self-pay | Admitting: Obstetrics and Gynecology

## 2015-05-24 ENCOUNTER — Ambulatory Visit (INDEPENDENT_AMBULATORY_CARE_PROVIDER_SITE_OTHER): Payer: Self-pay | Admitting: Obstetrics and Gynecology

## 2015-05-24 VITALS — BP 90/56 | HR 68 | Wt 179.0 lb

## 2015-05-24 DIAGNOSIS — Z369 Encounter for antenatal screening, unspecified: Secondary | ICD-10-CM

## 2015-05-24 DIAGNOSIS — Z331 Pregnant state, incidental: Secondary | ICD-10-CM

## 2015-05-24 DIAGNOSIS — Z131 Encounter for screening for diabetes mellitus: Secondary | ICD-10-CM

## 2015-05-24 DIAGNOSIS — Z3492 Encounter for supervision of normal pregnancy, unspecified, second trimester: Secondary | ICD-10-CM

## 2015-05-24 DIAGNOSIS — Z1389 Encounter for screening for other disorder: Secondary | ICD-10-CM

## 2015-05-24 LAB — POCT URINALYSIS DIPSTICK
Blood, UA: NEGATIVE
GLUCOSE UA: NEGATIVE
Ketones, UA: NEGATIVE
Leukocytes, UA: NEGATIVE
NITRITE UA: NEGATIVE

## 2015-05-24 NOTE — Progress Notes (Signed)
[id number] [redacted]w[redacted]d Estimated Date of Delivery: 08/18/15  Blood pressure 90/56, pulse 68, weight 179 lb (81.194 kg), last menstrual period 11/03/2014.   refer to the ob flow sheet for FH (28cm) and FHR, also BP, Wt, Urine results:negative  Patient reports good fetal movement, denies any bleeding and no rupture of membranes symptoms or regular contractions. Patient complaints: occasional contractions   Questions were answered. Assessment:  LROB, no noted probs Plan:  Continued routine obstetrical care,   F/u in 4 weeks for routine prenatal visit  This chart was scribed by Ludger Nutting, Medical Scribe, for Dr. [name] on 05/24/15 at 11:01 AM. This chart was reviewed by Dr. [name] for accuracy.   I personally performed the services described in this documentation, which was SCRIBED in my presence. The recorded information has been reviewed and considered accurate. It has been edited as necessary during review. Jonnie Kind, MD

## 2015-05-24 NOTE — Progress Notes (Signed)
Pt denies any problems or concerns at this time.  

## 2015-05-26 LAB — CBC
HEMOGLOBIN: 11.4 g/dL (ref 11.1–15.9)
Hematocrit: 34.2 % (ref 34.0–46.6)
MCH: 31 pg (ref 26.6–33.0)
MCHC: 33.3 g/dL (ref 31.5–35.7)
MCV: 93 fL (ref 79–97)
Platelets: 236 10*3/uL (ref 150–379)
RBC: 3.68 x10E6/uL — AB (ref 3.77–5.28)
RDW: 13.3 % (ref 12.3–15.4)
WBC: 9.8 10*3/uL (ref 3.4–10.8)

## 2015-05-26 LAB — RPR: RPR Ser Ql: NONREACTIVE

## 2015-05-26 LAB — ANTIBODY SCREEN: Antibody Screen: NEGATIVE

## 2015-05-26 LAB — GLUCOSE TOLERANCE, 2 HOURS W/ 1HR
GLUCOSE, 1 HOUR: 116 mg/dL (ref 65–179)
GLUCOSE, 2 HOUR: 89 mg/dL (ref 65–152)
Glucose, Fasting: 74 mg/dL (ref 65–91)

## 2015-05-26 LAB — HSV 2 ANTIBODY, IGG: HSV 2 Glycoprotein G Ab, IgG: <0.91 index (ref 0.00–0.90)

## 2015-05-26 LAB — HIV ANTIBODY (ROUTINE TESTING W REFLEX): HIV SCREEN 4TH GENERATION: NONREACTIVE

## 2015-05-31 ENCOUNTER — Encounter: Payer: Self-pay | Admitting: Advanced Practice Midwife

## 2015-05-31 ENCOUNTER — Ambulatory Visit (INDEPENDENT_AMBULATORY_CARE_PROVIDER_SITE_OTHER): Payer: Self-pay | Admitting: Advanced Practice Midwife

## 2015-05-31 VITALS — BP 90/50 | HR 80 | Wt 181.0 lb

## 2015-05-31 DIAGNOSIS — O4703 False labor before 37 completed weeks of gestation, third trimester: Secondary | ICD-10-CM

## 2015-05-31 DIAGNOSIS — Z1389 Encounter for screening for other disorder: Secondary | ICD-10-CM

## 2015-05-31 DIAGNOSIS — Z3493 Encounter for supervision of normal pregnancy, unspecified, third trimester: Secondary | ICD-10-CM

## 2015-05-31 DIAGNOSIS — Z331 Pregnant state, incidental: Secondary | ICD-10-CM

## 2015-05-31 LAB — POCT URINALYSIS DIPSTICK
Blood, UA: NEGATIVE
Glucose, UA: NEGATIVE
Ketones, UA: NEGATIVE
Leukocytes, UA: NEGATIVE
Nitrite, UA: NEGATIVE
PROTEIN UA: NEGATIVE

## 2015-05-31 NOTE — Progress Notes (Signed)
[id number] [redacted]w[redacted]d Estimated Date of Delivery: 08/18/15  Blood pressure 90/50, pulse 80, weight 181 lb (82.101 kg), last menstrual period 11/03/2014.   WORK IN:  "TIGHTENING TWICE AN HOUR FOR 2-3 DAYS, GREEN DC, ITCH, PRESSURE"  BP weight and urine results all reviewed and noted.  Please refer to the obstetrical flow sheet for the fundal height and fetal heart rate documentation:  Patient reports good fetal movement, denies any bleeding and no rupture of membranes symptoms or regular contractions. SSE:  Thick greenish white dc c/w yeast. Wet prep + yeast, few WBC.  Cx LTC, firm.   All questions were answered.  Plan:  Continued routine obstetrical care, gynezole 1 sample given. If sx still present over the weekend, finish up with OTC monistat (dollar tree) for a few days.  Info on Kinesiology taping given   Follow up in as scheduled for OB appointment,

## 2015-06-01 ENCOUNTER — Inpatient Hospital Stay (HOSPITAL_COMMUNITY)
Admission: AD | Admit: 2015-06-01 | Discharge: 2015-06-01 | Disposition: A | Discharge status: Home / Self Care | Payer: Self-pay | Source: Ambulatory Visit | Attending: Obstetrics and Gynecology | Admitting: Obstetrics and Gynecology

## 2015-06-01 ENCOUNTER — Encounter (HOSPITAL_COMMUNITY): Payer: Self-pay

## 2015-06-01 DIAGNOSIS — Z0371 Encounter for suspected problem with amniotic cavity and membrane ruled out: Secondary | ICD-10-CM

## 2015-06-01 DIAGNOSIS — O9989 Other specified diseases and conditions complicating pregnancy, childbirth and the puerperium: Secondary | ICD-10-CM | POA: Insufficient documentation

## 2015-06-01 DIAGNOSIS — Z3A28 28 weeks gestation of pregnancy: Secondary | ICD-10-CM | POA: Insufficient documentation

## 2015-06-01 LAB — AMNISURE RUPTURE OF MEMBRANE (ROM) NOT AT ARMC: Amnisure ROM: NEGATIVE

## 2015-06-01 LAB — POCT FERN TEST: POCT Fern Test: NEGATIVE

## 2015-06-01 NOTE — MAU Note (Signed)
Pt presents complaining of leaking of fluid that started at 3pm. States she was getting out of the bed and had a big gush. Still leaking fluid. Denies bleeding. Reports good fetal movement.

## 2015-06-01 NOTE — Discharge Instructions (Signed)

## 2015-06-14 LAB — OB RESULTS CONSOLE HIV ANTIBODY (ROUTINE TESTING): HIV: NONREACTIVE

## 2015-06-21 ENCOUNTER — Ambulatory Visit (INDEPENDENT_AMBULATORY_CARE_PROVIDER_SITE_OTHER): Payer: Self-pay | Admitting: Obstetrics and Gynecology

## 2015-06-21 ENCOUNTER — Encounter: Payer: Self-pay | Admitting: Obstetrics and Gynecology

## 2015-06-21 VITALS — BP 90/54 | HR 84 | Wt 184.0 lb

## 2015-06-21 DIAGNOSIS — Z3493 Encounter for supervision of normal pregnancy, unspecified, third trimester: Secondary | ICD-10-CM

## 2015-06-21 DIAGNOSIS — Z331 Pregnant state, incidental: Secondary | ICD-10-CM

## 2015-06-21 DIAGNOSIS — Z1389 Encounter for screening for other disorder: Secondary | ICD-10-CM

## 2015-06-21 LAB — POCT URINALYSIS DIPSTICK
Blood, UA: NEGATIVE
GLUCOSE UA: NEGATIVE
KETONES UA: NEGATIVE
Leukocytes, UA: NEGATIVE
Nitrite, UA: NEGATIVE

## 2015-06-21 NOTE — Progress Notes (Signed)
Pt denies any problems or concerns at this time.  

## 2015-06-21 NOTE — Progress Notes (Signed)
Patient ID: Ashlee Vincent, female   DOB: 10-15-1996, 19 y.o.   MRN: [id number]  [id number] [redacted]w[redacted]d Estimated Date of Delivery: 08/18/15  Blood pressure 90/54, pulse 84, weight 184 lb (83.462 kg), last menstrual period 11/03/2014.   refer to the ob flow sheet for FH and FHR, also BP, Wt, Urine results:notable for negative  Patient reports  + good fetal movement, denies any bleeding and no rupture of membranes symptoms or regular contractions. Patient complaints: She does not have any concerns or complaints at this time. Pt passed her diabetes test. She has had intermittent contractions, but denies vaginal bleeding.  FHR-145 bpm FH-32 cm  Questions were answered. Assessment: [redacted]w[redacted]d; [id number] LROB pt   Plan:  Continued routine LROB obstetrical care, Nexplanon for birth control after pregnancy  F/u in 2 weeks for routine pre-natal care   This chart was scribed for Jonnie Kind, MD by Tula Nakayama, Medical Scribe. This patient was seen in room 2 and the patient's care was started at 11:35 AM.   I personally performed the services described in this documentation, which was SCRIBED in my presence. The recorded information has been reviewed and considered accurate. It has been edited as necessary during review. Jonnie Kind, MD

## 2015-07-05 ENCOUNTER — Ambulatory Visit (INDEPENDENT_AMBULATORY_CARE_PROVIDER_SITE_OTHER): Payer: Self-pay | Admitting: Women's Health

## 2015-07-05 ENCOUNTER — Encounter: Payer: Self-pay | Admitting: Women's Health

## 2015-07-05 VITALS — BP 88/52 | HR 80 | Wt 188.0 lb

## 2015-07-05 DIAGNOSIS — Z331 Pregnant state, incidental: Secondary | ICD-10-CM

## 2015-07-05 DIAGNOSIS — Z1389 Encounter for screening for other disorder: Secondary | ICD-10-CM

## 2015-07-05 DIAGNOSIS — Z3493 Encounter for supervision of normal pregnancy, unspecified, third trimester: Secondary | ICD-10-CM

## 2015-07-05 LAB — POCT URINALYSIS DIPSTICK
Glucose, UA: NEGATIVE
Ketones, UA: NEGATIVE
Leukocytes, UA: NEGATIVE
Nitrite, UA: NEGATIVE
Protein, UA: NEGATIVE
RBC UA: NEGATIVE

## 2015-07-05 NOTE — Progress Notes (Signed)
Low-risk OB appointment [id number] [redacted]w[redacted]d Estimated Date of Delivery: 08/18/15 BP 88/52 mmHg  Pulse 80  Wt 188 lb (85.276 kg)  LMP 11/03/2014  BP, weight, and urine reviewed.  Refer to obstetrical flow sheet for FH & FHR.  Reports good fm.  Denies regular uc's, lof, vb, or uti s/s. No complaints. Reviewed ptl s/s, fkc. Recommended Tdap at HD/PCP per CDC guidelines.  Plan:  Continue routine obstetrical care  F/U in 2wks for OB appointment

## 2015-07-05 NOTE — Patient Instructions (Signed)
[place] Pediatricians/Family Doctors:  [facility] [phone]                 [place] [phone] (usually not accepting new patients unless you have family there already, you are always welcome to call and ask)            [facility] ([place]) [phone]   [facility] Pediatricians/Family Doctors:   [place]: [phone]  [facility]: [phone]    Call the office [phone]) or go to [facility] if:  You begin to have strong, frequent contractions  Your water breaks.  Sometimes it is a big gush of fluid, sometimes it is just a trickle that keeps getting your panties wet or running down your legs  You have vaginal bleeding.  It is normal to have a small amount of spotting if your cervix was checked.   You don't feel your baby moving like normal.  If you don't, get you something to eat and drink and lay down and focus on feeling your baby move.  You should feel at least 10 movements in 2 hours.  If you don't, you should call the office or go to [facility].    Tdap Vaccine  It is recommended that you get the Tdap vaccine during the third trimester of EACH pregnancy to help protect your baby from getting pertussis (whooping cough)  27-36 weeks is the BEST time to do this so that you can pass the protection on to your baby. During pregnancy is better than after pregnancy, but if you are unable to get it during pregnancy it will be offered at the hospital.   You can get this vaccine at the health department or your family doctor  Everyone who will be around your baby should also be up-to-date on their vaccines. Adults (who are not pregnant) only need 1 dose of Tdap during adulthood.    Preterm Labor Information Preterm labor is when labor starts at less than 37 weeks of pregnancy. The normal length of a pregnancy is 39 to 41  weeks. CAUSES Often, there is no identifiable underlying cause as to why a woman goes into preterm labor. One of the most common known causes of preterm labor is infection. Infections of the uterus, cervix, vagina, amniotic sac, bladder, kidney, or even the lungs (pneumonia) can cause labor to start. Other suspected causes of preterm labor include:   Urogenital infections, such as yeast infections and bacterial vaginosis.   Uterine abnormalities (uterine shape, uterine septum, fibroids, or bleeding from the placenta).   A cervix that has been operated on (it may fail to stay closed).   Malformations in the fetus.   Multiple gestations (twins, triplets, and so on).   Breakage of the amniotic sac.  RISK FACTORS  Having a previous history of preterm labor.   Having premature rupture of membranes (PROM).   Having a placenta that covers the opening of the cervix (placenta previa).   Having a placenta that separates from the uterus (placental abruption).   Having a cervix that is too weak to hold the fetus in the uterus (incompetent cervix).   Having too much fluid in the amniotic sac (polyhydramnios).   Taking illegal drugs or smoking while pregnant.   Not gaining enough weight while pregnant.   Being younger than 72 and older than 20 years old.   Having  a low socioeconomic status.   Being African American. SYMPTOMS Signs and symptoms of preterm labor include:   Menstrual-like cramps, abdominal pain, or back pain.  Uterine contractions that are regular, as frequent as six in an hour, regardless of their intensity (may be mild or painful).  Contractions that start on the top of the uterus and spread down to the lower abdomen and back.   A sense of increased pelvic pressure.   A watery or bloody mucus discharge that comes from the vagina.  TREATMENT Depending on the length of the pregnancy and other circumstances, your health care provider may suggest  bed rest. If necessary, there are medicines that can be given to stop contractions and to mature the fetal lungs. If labor happens before 34 weeks of pregnancy, a prolonged hospital stay may be recommended. Treatment depends on the condition of both you and the fetus.  WHAT SHOULD YOU DO IF YOU THINK YOU ARE IN PRETERM LABOR? Call your health care provider right away. You will need to go to the hospital to get checked immediately. HOW CAN YOU PREVENT PRETERM LABOR IN FUTURE PREGNANCIES? You should:   Stop smoking if you smoke.  Maintain healthy weight gain and avoid chemicals and drugs that are not necessary.  Be watchful for any type of infection.  Inform your health care provider if you have a known history of preterm labor. Document Released: 01/25/2004 Document Revised: 07/07/2013 Document Reviewed: 12/07/2012 [facility] Patient Information 2015 [place], [place]. This information is not intended to replace advice given to you by your health care provider. Make sure you discuss any questions you have with your health care provider.

## 2015-07-19 ENCOUNTER — Ambulatory Visit (INDEPENDENT_AMBULATORY_CARE_PROVIDER_SITE_OTHER): Payer: Self-pay | Admitting: Advanced Practice Midwife

## 2015-07-19 VITALS — BP 92/48 | HR 76 | Wt 190.5 lb

## 2015-07-19 DIAGNOSIS — Z331 Pregnant state, incidental: Secondary | ICD-10-CM

## 2015-07-19 DIAGNOSIS — Z3493 Encounter for supervision of normal pregnancy, unspecified, third trimester: Secondary | ICD-10-CM

## 2015-07-19 DIAGNOSIS — Z1389 Encounter for screening for other disorder: Secondary | ICD-10-CM

## 2015-07-19 LAB — POCT URINALYSIS DIPSTICK
Glucose, UA: NEGATIVE
Ketones, UA: NEGATIVE
NITRITE UA: NEGATIVE
Protein, UA: NEGATIVE
RBC UA: NEGATIVE

## 2015-07-19 NOTE — Progress Notes (Signed)
[id number] [redacted]w[redacted]d Estimated Date of Delivery: 08/18/15  Blood pressure 92/48, pulse 76, weight 190 lb 8 oz (86.41 kg), last menstrual period 11/03/2014.   BP weight and urine results all reviewed and noted.  Please refer to the obstetrical flow sheet for the fundal height and fetal heart rate documentation:  Patient reports good fetal movement, denies any bleeding and no rupture of membranes symptoms or regular contractions. Patient is without complaints. All questions were answered.  Orders Placed This Encounter  Procedures  . POCT urinalysis dipstick    Plan:  Continued routine obstetrical care, GBS  Return in about 1 week (around 07/26/2015) for LROB.

## 2015-07-27 ENCOUNTER — Encounter: Payer: Self-pay | Admitting: Advanced Practice Midwife

## 2015-07-27 ENCOUNTER — Ambulatory Visit (INDEPENDENT_AMBULATORY_CARE_PROVIDER_SITE_OTHER): Payer: Self-pay | Admitting: Advanced Practice Midwife

## 2015-07-27 VITALS — BP 110/70 | HR 80 | Wt 191.0 lb

## 2015-07-27 DIAGNOSIS — Z331 Pregnant state, incidental: Secondary | ICD-10-CM

## 2015-07-27 DIAGNOSIS — Z1389 Encounter for screening for other disorder: Secondary | ICD-10-CM

## 2015-07-27 DIAGNOSIS — Z3493 Encounter for supervision of normal pregnancy, unspecified, third trimester: Secondary | ICD-10-CM

## 2015-07-27 DIAGNOSIS — Z369 Encounter for antenatal screening, unspecified: Secondary | ICD-10-CM

## 2015-07-27 LAB — POCT URINALYSIS DIPSTICK
Blood, UA: NEGATIVE
Glucose, UA: NEGATIVE
KETONES UA: NEGATIVE
Leukocytes, UA: NEGATIVE
Nitrite, UA: NEGATIVE
Protein, UA: NEGATIVE

## 2015-07-27 LAB — OB RESULTS CONSOLE GBS: STREP GROUP B AG: NEGATIVE

## 2015-07-27 LAB — OB RESULTS CONSOLE GC/CHLAMYDIA
Chlamydia: NEGATIVE
Gonorrhea: NEGATIVE

## 2015-07-27 NOTE — Progress Notes (Signed)
Pt denies any problems or concerns at this time.  

## 2015-07-27 NOTE — Patient Instructions (Signed)

## 2015-07-27 NOTE — Progress Notes (Signed)
[id number] [redacted]w[redacted]d Estimated Date of Delivery: 08/18/15  Blood pressure 110/70, pulse 80, weight 191 lb (86.637 kg), last menstrual period 11/03/2014.   BP weight and urine results all reviewed and noted.  Please refer to the obstetrical flow sheet for the fundal height and fetal heart rate documentation:  Patient reports good fetal movement, denies any bleeding and no rupture of membranes symptoms or regular contractions. Patient c/o vaginal swelling/itch since yesaterday.  SSE:  Dc c/w yeast All questions were answered.  Orders Placed This Encounter  Procedures  . GC/Chlamydia Probe Amp  . Strep Gp B NAA+Rflx  . POCT urinalysis dipstick    Plan:  Continued routine obstetrical care, OTC yeast meds.   Return in about 1 week (around 08/03/2015) for [place].

## 2015-07-28 LAB — GC/CHLAMYDIA PROBE AMP
CHLAMYDIA, DNA PROBE: NEGATIVE
Neisseria gonorrhoeae by PCR: NEGATIVE

## 2015-07-29 LAB — STREP GP B NAA+RFLX: Strep Gp B NAA+Rflx: NEGATIVE

## 2015-08-03 ENCOUNTER — Ambulatory Visit (INDEPENDENT_AMBULATORY_CARE_PROVIDER_SITE_OTHER): Payer: Self-pay | Admitting: Advanced Practice Midwife

## 2015-08-03 ENCOUNTER — Encounter: Payer: Self-pay | Admitting: Advanced Practice Midwife

## 2015-08-03 VITALS — BP 90/50 | HR 74 | Wt 191.0 lb

## 2015-08-03 DIAGNOSIS — Z1389 Encounter for screening for other disorder: Secondary | ICD-10-CM

## 2015-08-03 DIAGNOSIS — Z331 Pregnant state, incidental: Secondary | ICD-10-CM

## 2015-08-03 DIAGNOSIS — Z3493 Encounter for supervision of normal pregnancy, unspecified, third trimester: Secondary | ICD-10-CM

## 2015-08-03 LAB — POCT URINALYSIS DIPSTICK
Blood, UA: NEGATIVE
Glucose, UA: NEGATIVE
KETONES UA: NEGATIVE
Leukocytes, UA: NEGATIVE
Nitrite, UA: NEGATIVE
Protein, UA: NEGATIVE

## 2015-08-03 NOTE — Progress Notes (Signed)
Pt states that she is having a lot of pain in her lower back.

## 2015-08-03 NOTE — Progress Notes (Signed)
[id number] [redacted]w[redacted]d Estimated Date of Delivery: 08/18/15  Blood pressure 90/50, pulse 74, weight 191 lb (86.637 kg), last menstrual period 11/03/2014.   BP weight and urine results all reviewed and noted.  Please refer to the obstetrical flow sheet for the fundal height and fetal heart rate documentation:  Patient reports good fetal movement, denies any bleeding and no rupture of membranes symptoms or regular contractions. Patient still a little itchy.  Vag painted with gentian vioilet  All questions were answered.  Orders Placed This Encounter  Procedures  . POCT urinalysis dipstick    Plan:  Continued routine obstetrical care,   Return in about 1 week (around 08/10/2015) for LROB.

## 2015-08-10 ENCOUNTER — Ambulatory Visit (INDEPENDENT_AMBULATORY_CARE_PROVIDER_SITE_OTHER): Payer: Self-pay | Admitting: Advanced Practice Midwife

## 2015-08-10 VITALS — BP 120/60 | HR 80 | Wt 192.0 lb

## 2015-08-10 DIAGNOSIS — Z3493 Encounter for supervision of normal pregnancy, unspecified, third trimester: Secondary | ICD-10-CM

## 2015-08-10 DIAGNOSIS — Z1389 Encounter for screening for other disorder: Secondary | ICD-10-CM

## 2015-08-10 DIAGNOSIS — Z331 Pregnant state, incidental: Secondary | ICD-10-CM

## 2015-08-10 LAB — POCT URINALYSIS DIPSTICK
Blood, UA: NEGATIVE
Glucose, UA: NEGATIVE
KETONES UA: NEGATIVE
LEUKOCYTES UA: NEGATIVE
Nitrite, UA: NEGATIVE
PROTEIN UA: NEGATIVE

## 2015-08-10 NOTE — Progress Notes (Signed)
[id number] [redacted]w[redacted]d Estimated Date of Delivery: 08/18/15  Blood pressure 120/60, pulse 80, weight 192 lb (87.091 kg), last menstrual period 11/03/2014.   BP weight and urine results all reviewed and noted.  Please refer to the obstetrical flow sheet for the fundal height and fetal heart rate documentation:  Patient reports good fetal movement, denies any bleeding and no rupture of membranes symptoms or regular contractions. Patient is without complaints. All questions were answered.  Orders Placed This Encounter  Procedures  . POCT urinalysis dipstick    Plan:  Continued routine obstetrical care,   Return in about 1 week (around 08/17/2015) for LROB.

## 2015-08-13 ENCOUNTER — Encounter (HOSPITAL_COMMUNITY): Payer: Self-pay | Admitting: *Deleted

## 2015-08-13 ENCOUNTER — Inpatient Hospital Stay (HOSPITAL_COMMUNITY)
Admission: AD | Admit: 2015-08-13 | Discharge: 2015-08-15 | DRG: 774 | Disposition: A | Discharge status: Home / Self Care | Payer: Medicaid Other | Source: Ambulatory Visit | Attending: Obstetrics & Gynecology | Admitting: Obstetrics & Gynecology

## 2015-08-13 DIAGNOSIS — O1493 Unspecified pre-eclampsia, third trimester: Secondary | ICD-10-CM | POA: Diagnosis present

## 2015-08-13 DIAGNOSIS — Z833 Family history of diabetes mellitus: Secondary | ICD-10-CM

## 2015-08-13 DIAGNOSIS — IMO0001 Reserved for inherently not codable concepts without codable children: Secondary | ICD-10-CM | POA: Insufficient documentation

## 2015-08-13 DIAGNOSIS — Z3A39 39 weeks gestation of pregnancy: Secondary | ICD-10-CM | POA: Diagnosis present

## 2015-08-13 DIAGNOSIS — Z3493 Encounter for supervision of normal pregnancy, unspecified, third trimester: Secondary | ICD-10-CM

## 2015-08-13 LAB — CBC
HCT: 34.5 % — ABNORMAL LOW (ref 36.0–46.0)
Hemoglobin: 11.4 g/dL — ABNORMAL LOW (ref 12.0–15.0)
MCH: 27.2 pg (ref 26.0–34.0)
MCHC: 33 g/dL (ref 30.0–36.0)
MCV: 82.3 fL (ref 78.0–100.0)
PLATELETS: 243 10*3/uL (ref 150–400)
RBC: 4.19 MIL/uL (ref 3.87–5.11)
RDW: 14.8 % (ref 11.5–15.5)
WBC: 11.5 10*3/uL — ABNORMAL HIGH (ref 4.0–10.5)

## 2015-08-13 LAB — TYPE AND SCREEN
ABO/RH(D): O POS
ANTIBODY SCREEN: NEGATIVE

## 2015-08-13 LAB — ABO/RH: ABO/RH(D): O POS

## 2015-08-13 MED ORDER — OXYCODONE-ACETAMINOPHEN 5-325 MG PO TABS: 2.0000 | ORAL_TABLET | ORAL | Status: DC | PRN

## 2015-08-13 MED ORDER — LIDOCAINE HCL (PF) 1 % IJ SOLN
30.0000 mL | INTRAMUSCULAR | Status: DC | PRN
  Administered 2015-08-14: 30 mL via SUBCUTANEOUS
  Filled 2015-08-13: qty 30

## 2015-08-13 MED ORDER — OXYTOCIN 40 UNITS IN LACTATED RINGERS INFUSION - SIMPLE MED: 62.5000 mL/h | INTRAVENOUS | Status: DC

## 2015-08-13 MED ORDER — LIDOCAINE HCL (PF) 1 % IJ SOLN: 30.0000 mL | INTRAMUSCULAR | Status: DC | PRN

## 2015-08-13 MED ORDER — LACTATED RINGERS IV SOLN: INTRAVENOUS | Status: DC

## 2015-08-13 MED ORDER — CITRIC ACID-SODIUM CITRATE 334-500 MG/5ML PO SOLN: 30.0000 mL | ORAL | Status: DC | PRN

## 2015-08-13 MED ORDER — OXYTOCIN BOLUS FROM INFUSION: 500.0000 mL | INTRAVENOUS | Status: DC

## 2015-08-13 MED ORDER — ONDANSETRON HCL 4 MG/2ML IJ SOLN: 4.0000 mg | Freq: Four times a day (QID) | INTRAMUSCULAR | Status: DC | PRN

## 2015-08-13 MED ORDER — ACETAMINOPHEN 325 MG PO TABS: 650.0000 mg | ORAL_TABLET | ORAL | Status: DC | PRN

## 2015-08-13 MED ORDER — OXYTOCIN 40 UNITS IN LACTATED RINGERS INFUSION - SIMPLE MED
62.5000 mL/h | INTRAVENOUS | Status: DC
  Administered 2015-08-14: 62.5 mL/h via INTRAVENOUS
  Filled 2015-08-13: qty 1000

## 2015-08-13 MED ORDER — OXYCODONE-ACETAMINOPHEN 5-325 MG PO TABS: 1.0000 | ORAL_TABLET | ORAL | Status: DC | PRN

## 2015-08-13 MED ORDER — LACTATED RINGERS IV SOLN
INTRAVENOUS | Status: DC
Start: 2015-08-13 — End: 2015-08-13
  Administered 2015-08-13: 15:00:00 via INTRAVENOUS

## 2015-08-13 MED ORDER — NALBUPHINE HCL 10 MG/ML IJ SOLN
10.0000 mg | INTRAMUSCULAR | Status: DC | PRN
  Administered 2015-08-13 (×2): 10 mg via INTRAVENOUS
  Filled 2015-08-13: qty 1

## 2015-08-13 MED ORDER — LACTATED RINGERS IV SOLN: 500.0000 mL | INTRAVENOUS | Status: DC | PRN

## 2015-08-13 MED ORDER — TERBUTALINE SULFATE 1 MG/ML IJ SOLN
0.2500 mg | Freq: Once | INTRAMUSCULAR | Status: DC | PRN
  Filled 2015-08-13: qty 1

## 2015-08-13 MED ORDER — PROMETHAZINE HCL 25 MG/ML IJ SOLN
12.5000 mg | Freq: Four times a day (QID) | INTRAMUSCULAR | Status: DC | PRN
  Administered 2015-08-13: 12.5 mg via INTRAVENOUS
  Filled 2015-08-13: qty 1

## 2015-08-13 MED ORDER — OXYTOCIN 40 UNITS IN LACTATED RINGERS INFUSION - SIMPLE MED
1.0000 m[IU]/min | INTRAVENOUS | Status: DC
  Administered 2015-08-13: 2 m[IU]/min via INTRAVENOUS

## 2015-08-13 MED ORDER — ACETAMINOPHEN 325 MG PO TABS
650.0000 mg | ORAL_TABLET | ORAL | Status: DC | PRN
Start: 2015-08-13 — End: 2015-08-14
  Administered 2015-08-13 – 2015-08-14 (×2): 650 mg via ORAL
  Filled 2015-08-13: qty 2

## 2015-08-13 MED ORDER — NALBUPHINE SYRINGE 5 MG/0.5 ML
INJECTION | INTRAMUSCULAR | Status: AC
  Filled 2015-08-13: qty 0.5

## 2015-08-13 NOTE — Progress Notes (Signed)
Admit orders from [place]

## 2015-08-13 NOTE — MAU Note (Signed)
Pt presents to MAU with complaints of contractions since 6 this morning. Reports bloody show when wiping

## 2015-08-13 NOTE — Progress Notes (Signed)
Ashlee Vincent is a 19 y.o. [id number] at [redacted]w[redacted]d by LMP admitted for active labor  Subjective: Patient visibly uncomfortable during contractions. However, between contractions she is able to talk. She states that other than the pain from contractions, she feels that it is going well.  Objective: BP 132/66 mmHg  Pulse 81  Temp(Src) 98.6 F (37 C) (Oral)  Resp 18  Ht 4\' 11"  (1.499 m)  Wt 87.091 kg (192 lb)  BMI 38.76 kg/m2  LMP 11/03/2014      FHT:  FHR: 130 bpm, variability: moderate,  accelerations:  Present,  decelerations:  Present early and variable UC:   regular, every 5 minutes SVE:   Dilation: 6 Effacement (%): 80 Station: -1 Exam by:: k fields, rn  Labs: Lab Results  Component Value Date   WBC 11.5* 08/13/2015   HGB 11.4* 08/13/2015   HCT 34.5* 08/13/2015   MCV 82.3 08/13/2015   PLT 243 08/13/2015    Assessment / Plan: Spontaneous labor, progressing normally  Labor: Progressing normally Preeclampsia:  Labs and vitals stable Fetal Wellbeing:  Category I Pain Control:  Nubain I/D:  n/a Anticipated MOD:  NSVD  Chelsea Perfect 08/13/2015, 4:42 PM

## 2015-08-13 NOTE — H&P (Signed)
Ashlee Vincent is a 19 y.o. female presenting for early active labor. History OB History    Gravida Para Term Preterm AB TAB SAB Ectopic Multiple Living   4 1 1  0 2 0 2 0 0 1     Past Medical History  Diagnosis Date  . Medical history non-contributory    Past Surgical History  Procedure Laterality Date  . No past surgeries     Family History: family history includes Diabetes in her maternal grandmother and paternal grandmother. Social History:  reports that she has never smoked. She has never used smokeless tobacco. She reports that she does not drink alcohol or use illicit drugs.   Prenatal Transfer Tool  Maternal Diabetes: No Genetic Screening: Normal Maternal Ultrasounds/Referrals: Normal Fetal Ultrasounds or other Referrals:  None Maternal Substance Abuse:  No Significant Maternal Medications:  None Significant Maternal Lab Results:  None Other Comments:  None  Review of Systems  Constitutional: Negative.   HENT: Negative.   Eyes: Negative.   Respiratory: Negative.   Cardiovascular: Negative.   Gastrointestinal: Positive for abdominal pain.  Genitourinary: Negative.   Musculoskeletal: Negative.   Skin: Negative.   Neurological: Negative.   Endo/Heme/Allergies: Negative.   Psychiatric/Behavioral: Negative.     Dilation: 4.5 Effacement (%): 80 Station: -3 Exam by:: ginger morris rn Blood pressure 132/77, pulse 80, temperature 98 F (36.7 C), resp. rate 18, last menstrual period 11/03/2014. Maternal Exam:  Uterine Assessment: Contraction strength is moderate.  Contraction frequency is regular.   Abdomen: Patient reports no abdominal tenderness. Fetal presentation: vertex  Introitus: Normal vulva. Amniotic fluid character: not assessed.  Pelvis: adequate for delivery.   Cervix: Cervix evaluated by digital exam.     Fetal Exam Fetal Monitor Review: Mode: ultrasound.   Variability: moderate (6-25 bpm).   Pattern: no accelerations.    Fetal State  Assessment: Category I - tracings are normal.     Physical Exam  Constitutional: She is oriented to person, place, and time. She appears well-developed and well-nourished.  HENT:  Head: Normocephalic.  Eyes: Pupils are equal, round, and reactive to light.  Neck: Normal range of motion.  Cardiovascular: Normal rate, regular rhythm, normal heart sounds and intact distal pulses.   Respiratory: Effort normal and breath sounds normal.  GI: Soft. Bowel sounds are normal.  Genitourinary: Vagina normal and uterus normal.  Musculoskeletal: Normal range of motion.  Neurological: She is alert and oriented to person, place, and time. She has normal reflexes.  Skin: Skin is warm and dry.  Psychiatric: She has a normal mood and affect. Her behavior is normal. Judgment and thought content normal.    Prenatal labs: ABO, Rh: O/--/-- (02/24 1528) Antibody: Negative (07/06 1013) Rubella:   RPR: Non Reactive (07/06 1013)  HBsAg: Negative (02/24 1528)  HIV:    GBS:     Assessment/Plan: Active labor. GBS neg, admit   LAWSON, MARIE DARLENE 08/13/2015, 2:58 PM

## 2015-08-13 NOTE — Progress Notes (Signed)
Patient ID: Ashlee Vincent, female   DOB: 11-04-1996, 19 y.o.   MRN: [id number] S: uc's milder O: VSS, FHR baseline 145 mod variability, early decels, mild uc's 3-6. A: spont labor with inadequate uc's . P: pit aug of labor

## 2015-08-14 ENCOUNTER — Encounter (HOSPITAL_COMMUNITY): Payer: Self-pay

## 2015-08-14 DIAGNOSIS — Z3A39 39 weeks gestation of pregnancy: Secondary | ICD-10-CM

## 2015-08-14 DIAGNOSIS — O1493 Unspecified pre-eclampsia, third trimester: Secondary | ICD-10-CM

## 2015-08-14 DIAGNOSIS — Z833 Family history of diabetes mellitus: Secondary | ICD-10-CM

## 2015-08-14 LAB — RPR: RPR: NONREACTIVE

## 2015-08-14 MED ORDER — WITCH HAZEL-GLYCERIN EX PADS: 1.0000 "application " | MEDICATED_PAD | CUTANEOUS | Status: DC | PRN

## 2015-08-14 MED ORDER — OXYCODONE-ACETAMINOPHEN 5-325 MG PO TABS: 2.0000 | ORAL_TABLET | ORAL | Status: DC | PRN

## 2015-08-14 MED ORDER — ACETAMINOPHEN 325 MG PO TABS: 650.0000 mg | ORAL_TABLET | ORAL | Status: DC | PRN

## 2015-08-14 MED ORDER — ONDANSETRON HCL 4 MG/2ML IJ SOLN
4.0000 mg | INTRAMUSCULAR | Status: DC | PRN
Start: 2015-08-14 — End: 2015-08-15

## 2015-08-14 MED ORDER — ONDANSETRON HCL 4 MG PO TABS: 4.0000 mg | ORAL_TABLET | ORAL | Status: DC | PRN

## 2015-08-14 MED ORDER — OXYCODONE-ACETAMINOPHEN 5-325 MG PO TABS
1.0000 | ORAL_TABLET | ORAL | Status: DC | PRN
  Administered 2015-08-15: 1 via ORAL
  Filled 2015-08-14: qty 1

## 2015-08-14 MED ORDER — DIBUCAINE 1 % RE OINT
1.0000 | TOPICAL_OINTMENT | RECTAL | Status: DC | PRN
Start: 2015-08-14 — End: 2015-08-15

## 2015-08-14 MED ORDER — TETANUS-DIPHTH-ACELL PERTUSSIS 5-2.5-18.5 LF-MCG/0.5 IM SUSP: 0.5000 mL | Freq: Once | INTRAMUSCULAR | Status: DC

## 2015-08-14 MED ORDER — DIPHENHYDRAMINE HCL 25 MG PO CAPS: 25.0000 mg | ORAL_CAPSULE | Freq: Four times a day (QID) | ORAL | Status: DC | PRN

## 2015-08-14 MED ORDER — PRENATAL MULTIVITAMIN CH
1.0000 | ORAL_TABLET | Freq: Every day | ORAL | Status: DC
  Administered 2015-08-14 – 2015-08-15 (×2): 1 via ORAL
  Filled 2015-08-14 (×2): qty 1

## 2015-08-14 MED ORDER — IBUPROFEN 600 MG PO TABS
600.0000 mg | ORAL_TABLET | Freq: Four times a day (QID) | ORAL | Status: DC
  Administered 2015-08-14 – 2015-08-15 (×6): 600 mg via ORAL
  Filled 2015-08-14 (×6): qty 1

## 2015-08-14 MED ORDER — LANOLIN HYDROUS EX OINT: TOPICAL_OINTMENT | CUTANEOUS | Status: DC | PRN

## 2015-08-14 MED ORDER — SENNOSIDES-DOCUSATE SODIUM 8.6-50 MG PO TABS
2.0000 | ORAL_TABLET | ORAL | Status: DC
  Administered 2015-08-14: 2 via ORAL
  Filled 2015-08-14: qty 2

## 2015-08-14 MED ORDER — BENZOCAINE-MENTHOL 20-0.5 % EX AERO: 1.0000 "application " | INHALATION_SPRAY | CUTANEOUS | Status: DC | PRN

## 2015-08-14 MED ORDER — INFLUENZA VAC SPLIT QUAD 0.5 ML IM SUSY
0.5000 mL | PREFILLED_SYRINGE | INTRAMUSCULAR | Status: AC
  Administered 2015-08-15: 0.5 mL via INTRAMUSCULAR

## 2015-08-14 MED ORDER — SIMETHICONE 80 MG PO CHEW: 80.0000 mg | CHEWABLE_TABLET | ORAL | Status: DC | PRN

## 2015-08-14 MED ORDER — ZOLPIDEM TARTRATE 5 MG PO TABS: 5.0000 mg | ORAL_TABLET | Freq: Every evening | ORAL | Status: DC | PRN

## 2015-08-14 NOTE — Lactation Note (Signed)
This note was copied from the chart of Ashlee Shawna Wearing. Lactation Consultation Note: Lactation Brochure given to mother with review of basics. Mother states that she breastfed her 19 year old for 2 years. Mother states that she is unsure if infant is getting enough. Mother states she sees colostrum when she hand expresses. Mother has given infant 3 ml of formula after breastfeeding two times. Mother was given a hand pump and advised to use as needed. Advised mother to give hand expressed EBM before formula. Mother receptive to plan. Support and encouragement given to mother. Advised to feed infant frequently and to do Skin to Skin frequently, lots of visitors in the room . Infant being held by family member and beginning to cue.  Reviewed baby and me book. Advised mother to page LC to check latch. Mother receptive to all teaching.   Patient Name: Ashlee Vincent [id number]'F Date: 08/14/2015     Maternal Data    Feeding Feeding Type: Formula Length of feed: 10 min  LATCH Score/Interventions Latch:  (enc mother to call with next breastfeeding for assessment)                    Lactation Tools Discussed/Used     Consult Status      Darla Lesches 08/14/2015, 5:16 PM

## 2015-08-15 ENCOUNTER — Ambulatory Visit: Payer: Self-pay

## 2015-08-15 MED ORDER — IBUPROFEN 600 MG PO TABS: 600.0000 mg | ORAL_TABLET | Freq: Four times a day (QID) | ORAL | Status: DC

## 2015-08-15 MED ORDER — ACETAMINOPHEN 325 MG PO TABS: 650.0000 mg | ORAL_TABLET | ORAL | Status: DC | PRN

## 2015-08-15 NOTE — Discharge Summary (Signed)
OB Discharge Summary  Patient Name: Ashlee Vincent DOB: September 15, 1996 MRN: [id number]  Date of admission: 08/13/2015 Delivering MD: Koren Shiver D   Date of discharge: 08/15/2015  Admitting diagnosis: 39 WKS, CTXS Intrauterine pregnancy: [redacted]w[redacted]d     Secondary diagnosis: None     Discharge diagnosis: Term Pregnancy Delivered                                                                                                Post partum procedures:none  Augmentation: AROM and Pitocin  Complications: None  Hospital course:  Onset of Labor With Vaginal Delivery     19 y.o. yo [id number] at [redacted]w[redacted]d was admitted in Active Laboron 08/13/2015. Patient had an uncomplicated labor course as follows:  Membrane Rupture Time/Date: 3:12 PM ,08/13/2015   Intrapartum Procedures: Episiotomy: None [1]                                         Lacerations:  1st degree [2];Labial [10]  Mediations and procedures used include: Pitocin and AROM  Patient had a delivery of a Viable infant. 08/14/2015  Information for the patient's newborn:  Akua, Blethen [id number]  Delivery Method: Vaginal, Spontaneous Delivery (Filed from Delivery Summary)    Pateint had an uncomplicated postpartum course.  She is ambulating, tolerating a regular diet, passing flatus, and urinating well. Patient is discharged home in stable condition on No discharge date for patient encounter.Marland Kitchen    Physical exam  Filed Vitals:   08/14/15 0340 08/14/15 0730 08/14/15 1300 08/14/15 1843  BP: 104/50 98/49 134/54 112/66  Pulse: 109 89 92 87  Temp: 98.3 F (36.8 C) 99 F (37.2 C) 97.2 F (36.2 C) 97.6 F (36.4 C)  TempSrc: Oral Oral Oral Oral  Resp: 18  18 19   Height:      Weight:      SpO2: 98%      General: alert, cooperative and no distress  CV: regular rate and rhythm Pulm: CTAB Uterine Fundus: firm Incision: N/A Labs: Lab Results  Component Value Date   WBC 11.5* 08/13/2015   HGB 11.4* 08/13/2015   HCT 34.5* 08/13/2015    MCV 82.3 08/13/2015   PLT 243 08/13/2015   CMP Latest Ref Rng 03/12/2014  Glucose 70 - 99 mg/dL 98  BUN 6 - 23 mg/dL 8  Creatinine 0.50 - 1.10 mg/dL 0.57  Sodium 137 - 147 mEq/L 141  Potassium 3.7 - 5.3 mEq/L 4.1  Chloride 96 - 112 mEq/L 104  CO2 19 - 32 mEq/L 26  Calcium 8.4 - 10.5 mg/dL 9.1  Total Protein 6.0 - 8.3 g/dL 7.5  Total Bilirubin 0.3 - 1.2 mg/dL 0.4  Alkaline Phos 39 - 117 U/L 82  AST 0 - 37 U/L 25  ALT 0 - 35 U/L 13    Discharge instruction: per After Visit Summary and "Baby and Me Booklet".  Medications:  Current facility-administered medications:  .  acetaminophen (TYLENOL) tablet 650 mg, 650 mg, Oral, Q4H PRN, Verner Mould,  MD .  benzocaine-Menthol (DERMOPLAST) 20-0.5 % topical spray 1 application, 1 application, Topical, PRN, Verner Mould, MD .  witch hazel-glycerin (TUCKS) pad 1 application, 1 application, Topical, PRN **AND** dibucaine (NUPERCAINAL) 1 % rectal ointment 1 application, 1 application, Rectal, PRN, Verner Mould, MD .  diphenhydrAMINE (BENADRYL) capsule 25 mg, 25 mg, Oral, Q6H PRN, Verner Mould, MD .  ibuprofen (ADVIL,MOTRIN) tablet 600 mg, 600 mg, Oral, 4 times per day, Verner Mould, MD, 600 mg at 08/15/15 0536 .  Influenza vac split quadrivalent PF (FLUARIX) injection 0.5 mL, 0.5 mL, Intramuscular, Tomorrow-1000, Lavonia Drafts, MD .  lanolin ointment, , Topical, PRN, Verner Mould, MD .  lidocaine (PF) (XYLOCAINE) 1 % injection 30 mL, 30 mL, Subcutaneous, PRN, Keitha Butte, CNM, 30 mL at 08/14/15 0055 .  ondansetron (ZOFRAN) tablet 4 mg, 4 mg, Oral, Q4H PRN **OR** ondansetron (ZOFRAN) injection 4 mg, 4 mg, Intravenous, Q4H PRN, Verner Mould, MD .  oxyCODONE-acetaminophen (PERCOCET/ROXICET) 5-325 MG per tablet 1 tablet, 1 tablet, Oral, Q4H PRN, Verner Mould, MD .  oxyCODONE-acetaminophen (PERCOCET/ROXICET) 5-325 MG per tablet 2 tablet, 2 tablet,  Oral, Q4H PRN, Verner Mould, MD .  prenatal multivitamin tablet 1 tablet, 1 tablet, Oral, Q1200, Verner Mould, MD, 1 tablet at 08/14/15 1134 .  senna-docusate (Senokot-S) tablet 2 tablet, 2 tablet, Oral, Q24H, Verner Mould, MD, 2 tablet at 08/14/15 2314 .  simethicone (MYLICON) chewable tablet 80 mg, 80 mg, Oral, PRN, Verner Mould, MD .  Tdap [facility]) injection 0.5 mL, 0.5 mL, Intramuscular, Once, Verner Mould, MD .  zolpidem [facility]) tablet 5 mg, 5 mg, Oral, QHS PRN, Verner Mould, MD  Diet: routine diet  Activity: Advance as tolerated. Pelvic rest for 6 weeks.   Outpatient follow up:6 weeks  Postpartum contraception: Undecided  Newborn Data: Live born female  Birth Weight: 7 lb 8.8 oz (3425 g) APGAR: 7, 9  Baby Feeding: Bottle and Breast Disposition:home with mother   08/15/2015 Alice Rieger, Med Student  OB FELLOW HISTORY AND PHYSICAL ATTESTATION  I have seen and examined this patient; I agree with above documentation in the resident's note.   Patsy Lager Wouk 08/15/2015, 7:39 AM

## 2015-08-15 NOTE — Lactation Note (Signed)
This note was copied from the chart of Ashlee Vincent. Lactation Consultation Note  Patient Name: Ashlee Vincent JSEGB'T Date: 08/15/2015 Reason for consult: Follow-up assessment;Hyperbilirubinemia Experienced bf mom states that she did not have any milk but is starting to feel full now. Went over breast changes, feeding frequency, belly size, milk transition. She does know how to manually express but did not demonstrate, lots of visitors in room. She has a Metall[employer] but is not using it. Touched on engorgement prevention/treatment. Aware of support group and O/P lactation services.     Maternal Data Has patient been taught Hand Expression?: Yes  Feeding Feeding Type: Formula Nipple Type: Slow - flow Length of feed: 5 min  LATCH Score/Interventions Latch:  (baby was sleeping, just fed formula )                    Lactation Tools Discussed/Used     Consult Status Consult Status: Follow-up Date: 08/16/15 Follow-up type: In-patient    Denzil Hughes 08/15/2015, 6:15 PM

## 2015-08-17 ENCOUNTER — Encounter: Payer: Self-pay | Admitting: Advanced Practice Midwife

## 2015-09-06 ENCOUNTER — Telehealth: Payer: Self-pay | Admitting: *Deleted

## 2015-09-06 NOTE — Telephone Encounter (Signed)
Santiago Glad with [facility] is at the pt house for a postpartum visit,SVD 08/14/2015, pt c/o frequent HA, B/P 104/70. Informed Santiago Glad to have pt push fluids and take tylenol for HA if no improvement call our office back. Pt has appt 09/28/2015 for postpartum visit.

## 2015-09-28 ENCOUNTER — Ambulatory Visit: Payer: Self-pay | Admitting: Advanced Practice Midwife

## 2015-10-10 ENCOUNTER — Ambulatory Visit: Payer: Self-pay | Admitting: Advanced Practice Midwife

## 2015-10-18 ENCOUNTER — Ambulatory Visit: Payer: Self-pay | Admitting: Advanced Practice Midwife

## 2015-10-18 ENCOUNTER — Encounter: Payer: Self-pay | Admitting: *Deleted

## 2016-02-23 ENCOUNTER — Other Ambulatory Visit (HOSPITAL_COMMUNITY): Payer: Self-pay | Admitting: *Deleted

## 2016-02-23 DIAGNOSIS — D492 Neoplasm of unspecified behavior of bone, soft tissue, and skin: Secondary | ICD-10-CM

## 2016-02-28 ENCOUNTER — Ambulatory Visit (HOSPITAL_COMMUNITY): Admission: RE | Admit: 2016-02-28 | Payer: Medicaid Other | Source: Ambulatory Visit

## 2017-05-17 ENCOUNTER — Emergency Department (HOSPITAL_COMMUNITY)
Admission: EM | Admit: 2017-05-17 | Discharge: 2017-05-17 | Disposition: A | Discharge status: Home / Self Care | Payer: Self-pay | Attending: Emergency Medicine | Admitting: Emergency Medicine

## 2017-05-17 ENCOUNTER — Emergency Department (HOSPITAL_COMMUNITY): Payer: Self-pay

## 2017-05-17 ENCOUNTER — Encounter (HOSPITAL_COMMUNITY): Payer: Self-pay

## 2017-05-17 DIAGNOSIS — R102 Pelvic and perineal pain: Secondary | ICD-10-CM | POA: Insufficient documentation

## 2017-05-17 DIAGNOSIS — N390 Urinary tract infection, site not specified: Secondary | ICD-10-CM | POA: Insufficient documentation

## 2017-05-17 LAB — CBC WITH DIFFERENTIAL/PLATELET
Basophils Absolute: 0 10*3/uL (ref 0.0–0.1)
Basophils Relative: 0 %
EOS PCT: 2 %
Eosinophils Absolute: 0.2 10*3/uL (ref 0.0–0.7)
HCT: 40.9 % (ref 36.0–46.0)
Hemoglobin: 14.2 g/dL (ref 12.0–15.0)
LYMPHS ABS: 2 10*3/uL (ref 0.7–4.0)
LYMPHS PCT: 29 %
MCH: 31.9 pg (ref 26.0–34.0)
MCHC: 34.7 g/dL (ref 30.0–36.0)
MCV: 91.9 fL (ref 78.0–100.0)
MONO ABS: 0.4 10*3/uL (ref 0.1–1.0)
Monocytes Relative: 6 %
Neutro Abs: 4.2 10*3/uL (ref 1.7–7.7)
Neutrophils Relative %: 63 %
PLATELETS: 259 10*3/uL (ref 150–400)
RBC: 4.45 MIL/uL (ref 3.87–5.11)
RDW: 12.6 % (ref 11.5–15.5)
WBC: 6.7 10*3/uL (ref 4.0–10.5)

## 2017-05-17 LAB — URINALYSIS, ROUTINE W REFLEX MICROSCOPIC
Bilirubin Urine: NEGATIVE
GLUCOSE, UA: NEGATIVE mg/dL
Ketones, ur: NEGATIVE mg/dL
Leukocytes, UA: NEGATIVE
NITRITE: POSITIVE — AB
PH: 6 (ref 5.0–8.0)
Protein, ur: NEGATIVE mg/dL
SPECIFIC GRAVITY, URINE: 1.019 (ref 1.005–1.030)

## 2017-05-17 LAB — COMPREHENSIVE METABOLIC PANEL
ALK PHOS: 86 U/L (ref 38–126)
ALT: 17 U/L (ref 14–54)
AST: 16 U/L (ref 15–41)
Albumin: 4.3 g/dL (ref 3.5–5.0)
Anion gap: 8 (ref 5–15)
BUN: 10 mg/dL (ref 6–20)
CALCIUM: 9 mg/dL (ref 8.9–10.3)
CO2: 24 mmol/L (ref 22–32)
CREATININE: 0.53 mg/dL (ref 0.44–1.00)
Chloride: 111 mmol/L (ref 101–111)
Glucose, Bld: 101 mg/dL — ABNORMAL HIGH (ref 65–99)
Potassium: 4 mmol/L (ref 3.5–5.1)
Sodium: 143 mmol/L (ref 135–145)
TOTAL PROTEIN: 7.5 g/dL (ref 6.5–8.1)
Total Bilirubin: 1 mg/dL (ref 0.3–1.2)

## 2017-05-17 LAB — LIPASE, BLOOD: Lipase: 21 U/L (ref 11–51)

## 2017-05-17 LAB — WET PREP, GENITAL
CLUE CELLS WET PREP: NONE SEEN
Sperm: NONE SEEN
Trich, Wet Prep: NONE SEEN
Yeast Wet Prep HPF POC: NONE SEEN

## 2017-05-17 LAB — PREGNANCY, URINE: Preg Test, Ur: NEGATIVE

## 2017-05-17 MED ORDER — IBUPROFEN 400 MG PO TABS
400.0000 mg | ORAL_TABLET | Freq: Once | ORAL | Status: AC
  Administered 2017-05-17: 400 mg via ORAL
  Filled 2017-05-17: qty 1

## 2017-05-17 MED ORDER — CEPHALEXIN 500 MG PO CAPS: 500.0000 mg | ORAL_CAPSULE | Freq: Four times a day (QID) | ORAL | 0 refills | Status: DC

## 2017-05-17 MED ORDER — NAPROXEN 250 MG PO TABS: 250.0000 mg | ORAL_TABLET | Freq: Two times a day (BID) | ORAL | 0 refills | Status: DC | PRN

## 2017-05-17 NOTE — ED Triage Notes (Signed)
Pt reports that she bleed for one month and went to health dept and was given BCP and has not started them yet. States she has had right lower abd pain since she started bleeding and now has a burning/pinching in the right lower abdomen. Sates she has vaginal bleeding now

## 2017-05-17 NOTE — ED Triage Notes (Signed)
Pt reports R lower Quad pain 2 weeks  [facility]

## 2017-05-17 NOTE — ED Provider Notes (Signed)
[place] DEPT Provider Note   CSN: [id number] Arrival date & time: 05/17/17  1141     History   Chief Complaint Chief Complaint  Patient presents with  . Abdominal Pain    HPI Ashlee Vincent is a 21 y.o. female.  HPI  Pt was seen at 1205.  Per pt, c/o gradual onset and persistence of constant right sided abd/pelvic "pain" for the past 2 to 3 weeks. Has been associated with vaginal bleeding for the past 1 month.  Describes the abd pain as "burning." Pt states she [facility] and rx BCP, but has not started them yet. Denies N/V, no diarrhea, no fevers, no back pain, no rash, no CP/SOB, no black or blood in stools, no dysuria/hematuria.     Past Medical History:  Diagnosis Date  . Medical history non-contributory     Patient Active Problem List   Diagnosis Date Noted  . NSVD (normal spontaneous vaginal delivery) 08/14/2015  . Active labor 08/13/2015  . Labor and delivery indication for care or intervention 08/13/2015  . Supervision of normal pregnancy 01/11/2015  . Miscarriage 06/27/2014  . UTI (urinary tract infection) in pregnancy in first trimester 06/21/2014  . Miscarriage 03/14/2014    Past Surgical History:  Procedure Laterality Date  . NO PAST SURGERIES      OB History    Gravida Para Term Preterm AB Living   4 2 2  0 2 2   SAB TAB Ectopic Multiple Live Births   2 0 0 0 2       Home Medications    Prior to Admission medications   Medication Sig Start Date End Date Taking? Authorizing Provider  acetaminophen (TYLENOL) 325 MG tablet Take 2 tablets (650 mg total) by mouth every 4 (four) hours as needed (for pain scale < 4). 08/15/15   Wouk, Ailene Rud, MD  ibuprofen (ADVIL,MOTRIN) 600 MG tablet Take 1 tablet (600 mg total) by mouth every 6 (six) hours. 08/15/15   Wouk, Ailene Rud, MD  Prenatal Vit-Fe Fumarate-FA (MULTIVITAMIN-PRENATAL) 27-0.8 MG TABS tablet Take 1 tablet by mouth daily at 12 noon.    [provider]    Family History Family History  Problem Relation Age of Onset  . Diabetes Maternal Grandmother   . Diabetes Paternal Grandmother     Social History Social History  Substance Use Topics  . Smoking status: Never Smoker  . Smokeless tobacco: Never Used  . Alcohol use No     Allergies   Patient has no known allergies.   Review of Systems Review of Systems ROS: Statement: All systems negative except as marked or noted in the HPI; Constitutional: Negative for fever and chills. ; ; Eyes: Negative for eye pain, redness and discharge. ; ; ENMT: Negative for ear pain, hoarseness, nasal congestion, sinus pressure and sore throat. ; ; Cardiovascular: Negative for chest pain, palpitations, diaphoresis, dyspnea and peripheral edema. ; ; Respiratory: Negative for cough, wheezing and stridor. ; ; Gastrointestinal: Negative for nausea, vomiting, diarrhea, blood in stool, hematemesis, jaundice and rectal bleeding. . ; ; Genitourinary: Negative for dysuria, flank pain and hematuria. ; ; GYN:  +pelvic pain, +vaginal bleeding, no vaginal discharge, no vulvar pain. ;; Musculoskeletal: Negative for back pain and neck pain. Negative for swelling and trauma.; ; Skin: Negative for pruritus, rash, abrasions, blisters, bruising and skin lesion.; ; Neuro: Negative for headache, lightheadedness and neck stiffness. Negative for weakness, altered level of consciousness, altered mental status, extremity weakness, paresthesias,  involuntary movement, seizure and syncope.       Physical Exam Updated Vital Signs BP 116/67 (BP Location: Right Arm)   Pulse 78   Temp 98.2 F (36.8 C) (Oral)   Resp 16   Ht 4\' 11"  (1.499 m)   Wt 81.6 kg (180 lb)   LMP 05/16/2017   SpO2 97%   BMI 36.36 kg/m   Physical Exam 1210: Physical examination:  Nursing notes reviewed; Vital signs and O2 SAT reviewed;  Constitutional: Well developed, Well nourished, Well hydrated, In no acute distress; Head:  Normocephalic, atraumatic;  Eyes: EOMI, PERRL, No scleral icterus; ENMT: Mouth and pharynx normal, Mucous membranes moist; Neck: Supple, Full range of motion, No lymphadenopathy; Cardiovascular: Regular rate and rhythm, No gallop; Respiratory: Breath sounds clear & equal bilaterally, No wheezes.  Speaking full sentences with ease, Normal respiratory effort/excursion; Chest: Nontender, Movement normal; Abdomen: Soft, Nontender, Nondistended, Normal bowel sounds; Genitourinary: No CVA tenderness. Pelvic exam performed with permission of pt and female ED tech assist during exam.  External genitalia w/o lesions. Vaginal vault without discharge, +small amount dark blood.  Cervix w/o lesions, not friable, GC/chlam and wet prep obtained and sent to lab.  Bimanual exam w/o CMT, uterine or left adnexal tenderness. +right pelvic tenderness.; Extremities: Pulses normal, No tenderness, No edema, No calf edema or asymmetry.; Neuro: AA&Ox3, Major CN grossly intact.  Speech clear. No gross focal motor or sensory deficits in extremities. Climbs on and off stretcher easily by herself. Gait steady.; Skin: Color normal, Warm, Dry.   ED Treatments / Results  Labs (all labs ordered are listed, but only abnormal results are displayed)   EKG  EKG Interpretation None       Radiology   Procedures Procedures (including critical care time)  Medications Ordered in ED Medications - No data to display   Initial Impression / Assessment and Plan / ED Course  I have reviewed the triage vital signs and the nursing notes.  Pertinent labs & imaging results that were available during my care of the patient were reviewed by me and considered in my medical decision making (see chart for details).  MDM Reviewed: previous chart, nursing note and vitals Reviewed previous: labs Interpretation: labs, CT scan and ultrasound   Results for orders placed or performed during the hospital encounter of 05/17/17  Wet prep, genital  Result Value Ref Range    Yeast Wet Prep HPF POC NONE SEEN NONE SEEN   Trich, Wet Prep NONE SEEN NONE SEEN   Clue Cells Wet Prep HPF POC NONE SEEN NONE SEEN   WBC, Wet Prep HPF POC FEW (A) NONE SEEN   Sperm NONE SEEN   Lipase, blood  Result Value Ref Range   Lipase 21 11 - 51 U/L  Comprehensive metabolic panel  Result Value Ref Range   Sodium 143 135 - 145 mmol/L   Potassium 4.0 3.5 - 5.1 mmol/L   Chloride 111 101 - 111 mmol/L   CO2 24 22 - 32 mmol/L   Glucose, Bld 101 (H) 65 - 99 mg/dL   BUN 10 6 - 20 mg/dL   Creatinine, Ser 0.53 0.44 - 1.00 mg/dL   Calcium 9.0 8.9 - 10.3 mg/dL   Total Protein 7.5 6.5 - 8.1 g/dL   Albumin 4.3 3.5 - 5.0 g/dL   AST 16 15 - 41 U/L   ALT 17 14 - 54 U/L   Alkaline Phosphatase 86 38 - 126 U/L   Total Bilirubin 1.0 0.3 - 1.2 mg/dL  GFR calc non Af Amer >60 >60 mL/min   GFR calc Af Amer >60 >60 mL/min   Anion gap 8 5 - 15  Urinalysis, Routine w reflex microscopic  Result Value Ref Range   Color, Urine YELLOW YELLOW   APPearance HAZY (A) CLEAR   Specific Gravity, Urine 1.019 1.005 - 1.030   pH 6.0 5.0 - 8.0   Glucose, UA NEGATIVE NEGATIVE mg/dL   Hgb urine dipstick SMALL (A) NEGATIVE   Bilirubin Urine NEGATIVE NEGATIVE   Ketones, ur NEGATIVE NEGATIVE mg/dL   Protein, ur NEGATIVE NEGATIVE mg/dL   Nitrite POSITIVE (A) NEGATIVE   Leukocytes, UA NEGATIVE NEGATIVE   RBC / HPF 0-5 0 - 5 RBC/hpf   WBC, UA 6-30 0 - 5 WBC/hpf   Bacteria, UA FEW (A) NONE SEEN   Squamous Epithelial / LPF 0-5 (A) NONE SEEN   Mucous PRESENT   CBC with Differential  Result Value Ref Range   WBC 6.7 4.0 - 10.5 K/uL   RBC 4.45 3.87 - 5.11 MIL/uL   Hemoglobin 14.2 12.0 - 15.0 g/dL   HCT 40.9 36.0 - 46.0 %   MCV 91.9 78.0 - 100.0 fL   MCH 31.9 26.0 - 34.0 pg   MCHC 34.7 30.0 - 36.0 g/dL   RDW 12.6 11.5 - 15.5 %   Platelets 259 150 - 400 K/uL   Neutrophils Relative % 63 %   Neutro Abs 4.2 1.7 - 7.7 K/uL   Lymphocytes Relative 29 %   Lymphs Abs 2.0 0.7 - 4.0 K/uL   Monocytes Relative 6 %    Monocytes Absolute 0.4 0.1 - 1.0 K/uL   Eosinophils Relative 2 %   Eosinophils Absolute 0.2 0.0 - 0.7 K/uL   Basophils Relative 0 %   Basophils Absolute 0.0 0.0 - 0.1 K/uL  Pregnancy, urine  Result Value Ref Range   Preg Test, Ur NEGATIVE NEGATIVE   US Pelvis Complete Result Date: 05/17/2017 CLINICAL DATA:  Vaginal bleeding for 1 month. EXAM: TRANSABDOMINAL AND TRANSVAGINAL ULTRASOUND OF PELVIS DOPPLER ULTRASOUND OF OVARIES TECHNIQUE: Both transabdominal and transvaginal ultrasound examinations of the pelvis were performed. Transabdominal technique was performed for global imaging of the pelvis including uterus, ovaries, adnexal regions, and pelvic cul-de-sac. It was necessary to proceed with endovaginal exam following the transabdominal exam to visualize the endometrium. Color and duplex Doppler ultrasound was utilized to evaluate blood flow to the ovaries. COMPARISON:  03/29/2015, prenatal ultrasound. FINDINGS: Uterus Measurements: 7.4 x 3.8 x 4.4 cm. No fibroids or other mass visualized. Endometrium Thickness: 2.8 mm. Few punctate calcifications are seen within the endometrial canal versus the transitional zone in the body of the uterus. Small amount of fluid within the lower endometrial canal, consistent with menstrual state. Right ovary Measurements: 3.2 x 1.8 x 1.7 cm. Normal appearance/no adnexal mass. Left ovary Measurements: 2.3 x 1.8 x 2.3 cm. Normal appearance/no adnexal mass. Pulsed Doppler evaluation of both ovaries demonstrates normal low-resistance arterial and venous waveforms. Other findings No abnormal free fluid. IMPRESSION: Normal appearance of bilateral ovaries. Few punctate calcifications within the endometrium versus the transitional zone in the body of the uterus, nonspecific. Otherwise normal appearance of the uterus. Please correlate clinically. Electronically Signed   By: Fidela Salisbury M.D.   On: 05/17/2017 14:01    Ct Renal Stone Study  Result Date: 05/17/2017 CLINICAL  DATA:  Right lower quadrant pain for 3 weeks. EXAM: CT ABDOMEN AND PELVIS WITHOUT CONTRAST TECHNIQUE: Multidetector CT imaging of the abdomen and pelvis was performed  following the standard protocol without IV contrast. COMPARISON:  None. FINDINGS: Lower chest: No acute abnormality. Hepatobiliary: No focal liver abnormality is seen. No gallstones, gallbladder wall thickening, or biliary dilatation. Pancreas: Unremarkable. No pancreatic ductal dilatation or surrounding inflammatory changes. Spleen: Normal in size without focal abnormality. Adrenals/Urinary Tract: Adrenal glands are unremarkable. Kidneys are normal, without renal calculi, focal lesion, or hydronephrosis. Bladder is unremarkable. Stomach/Bowel: Stomach is within normal limits. Appendix appears normal. No evidence of bowel wall thickening, distention, or inflammatory changes. Vascular/Lymphatic: Aortic atherosclerosis. No enlarged abdominal or pelvic lymph nodes. Reproductive: Uterus and bilateral adnexa are unremarkable. Other: No abdominal wall hernia or abnormality. No abdominopelvic ascites. Musculoskeletal: No acute or significant osseous findings. IMPRESSION: No evidence of acute abnormality within the abdomen or pelvis. No evidence of appendicitis. No evidence of obstructive uropathy. Electronically Signed   By: Fidela Salisbury M.D.   On: 05/17/2017 12:53    1415:  Will tx for UTI. Workup otherwise reassuring. Dx and testing d/w pt.  Questions answered.  Verb understanding, agreeable to d/c home with outpt f/u.    Final Clinical Impressions(s) / ED Diagnoses   Final diagnoses:  Pelvic pain    New Prescriptions New Prescriptions   No medications on file     Francine Graven, DO 05/25/17 1045

## 2017-05-17 NOTE — Discharge Instructions (Signed)
Take the prescriptions as directed.  Apply moist heat or ice to the area(s) of discomfort, for 15 minutes at a time, several times per day for the next few days.  Do not fall asleep on a heating or ice pack.  Call your regular medical or OB/GYN doctor on Monday to schedule a follow up appointment this week.  Return to the Emergency Department immediately if worsening.

## 2017-05-19 LAB — GC/CHLAMYDIA PROBE AMP (~~LOC~~) NOT AT ARMC
Chlamydia: NEGATIVE
Neisseria Gonorrhea: NEGATIVE

## 2017-08-11 IMAGING — CT CT RENAL STONE PROTOCOL
2 of 4 series · 16 of 46 positions shown, 18 images · non-contrast
Comparison: None.

CLINICAL DATA: Right lower quadrant pain for 3 weeks.

EXAM:
CT ABDOMEN AND PELVIS WITHOUT CONTRAST
TECHNIQUE: Multidetector CT imaging of the abdomen and pelvis was performed
following the standard protocol without IV contrast.

[Series 2: axial st · axial · 0.64mm/px · z∈[-575,-145]mm · 13 of 94 slices shown, 15 images]
[im 4/94  soft-tissue]
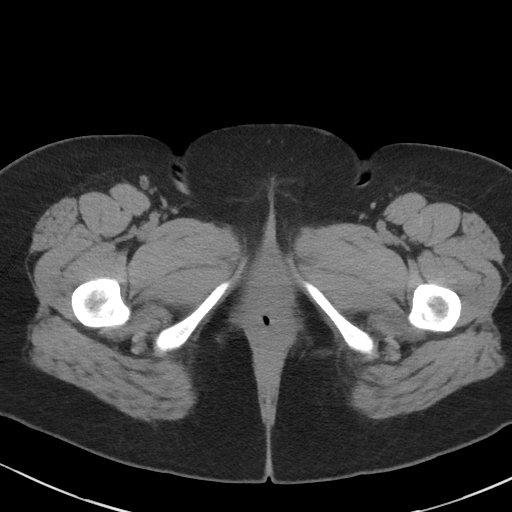
[im 4/94  bone]
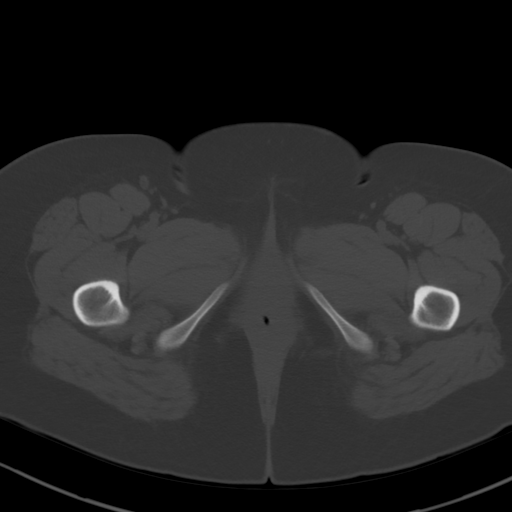
[im 12/94  soft-tissue]
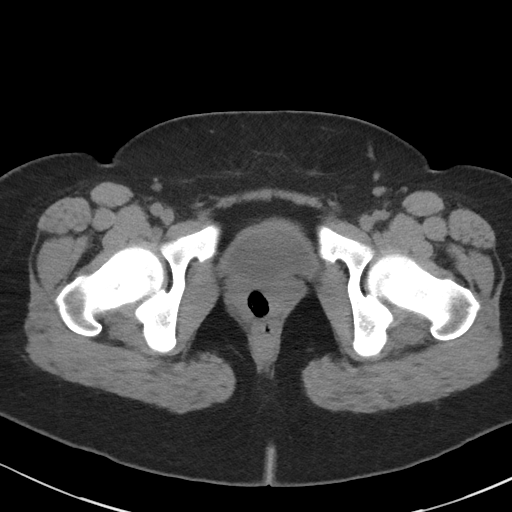
[im 19/94  soft-tissue]
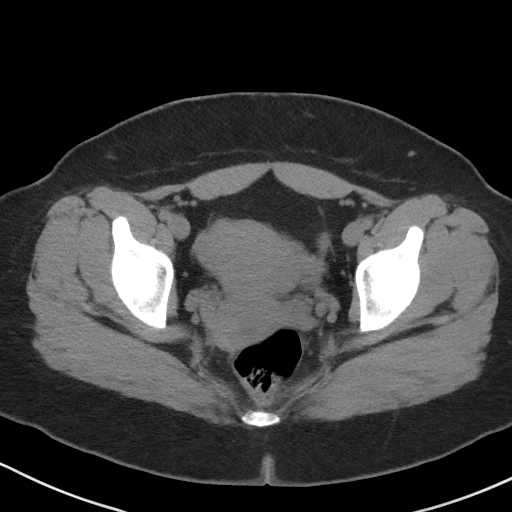
[im 27/94  soft-tissue]
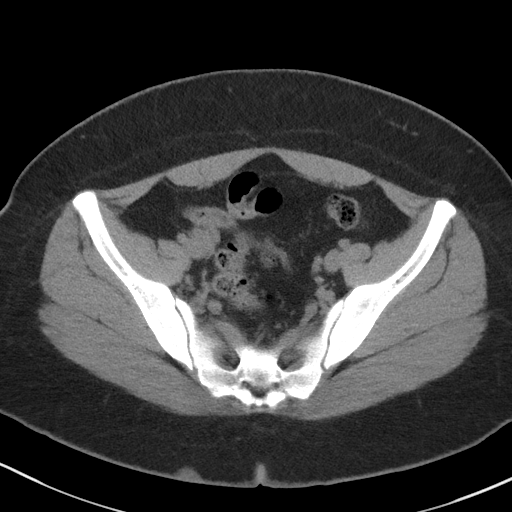
[im 34/94  soft-tissue]
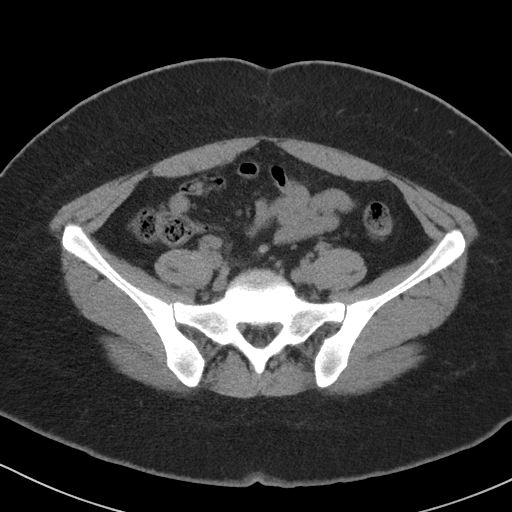
[im 41/94  soft-tissue]
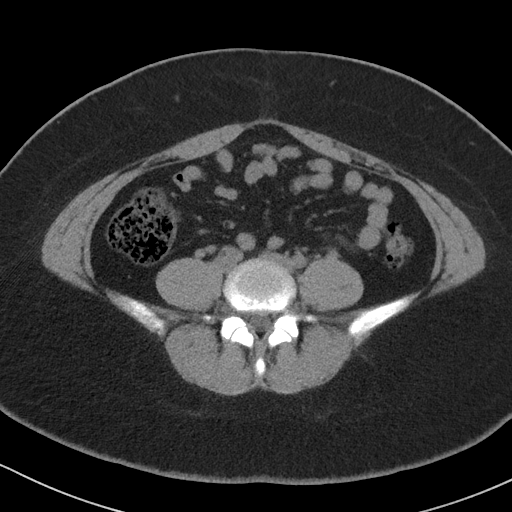
[im 49/94  soft-tissue]
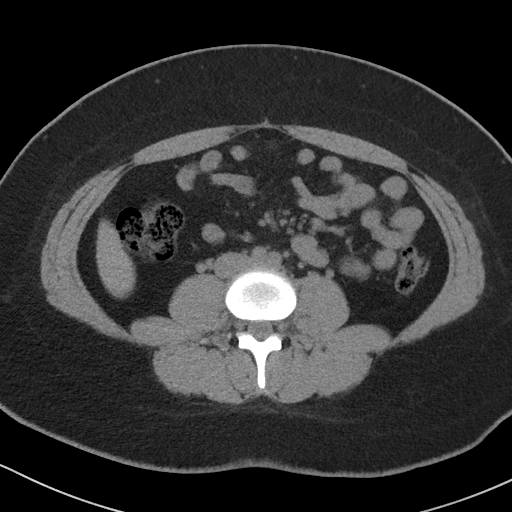
[im 53/94  soft-tissue]
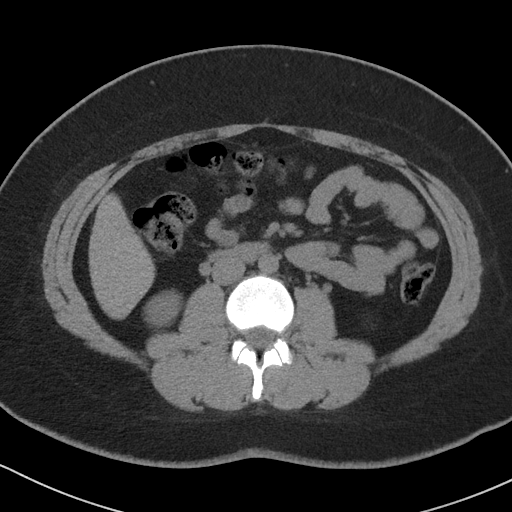
[im 60/94  soft-tissue]
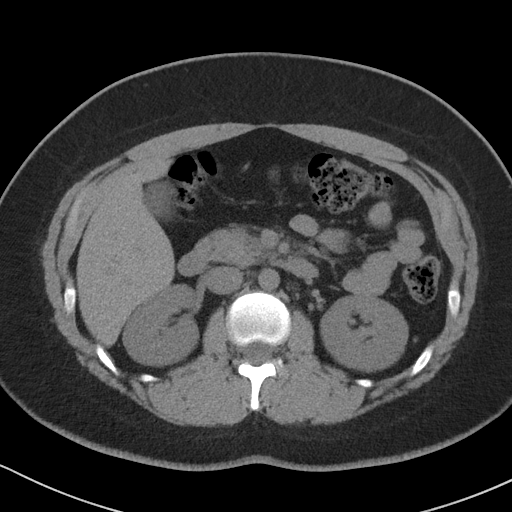
[im 60/94  bone]
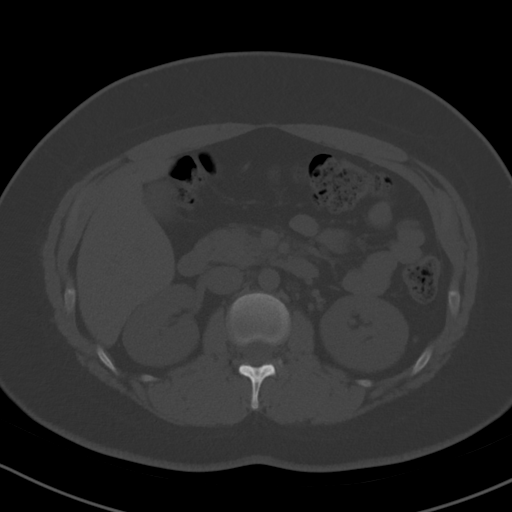
[im 67/94  soft-tissue]
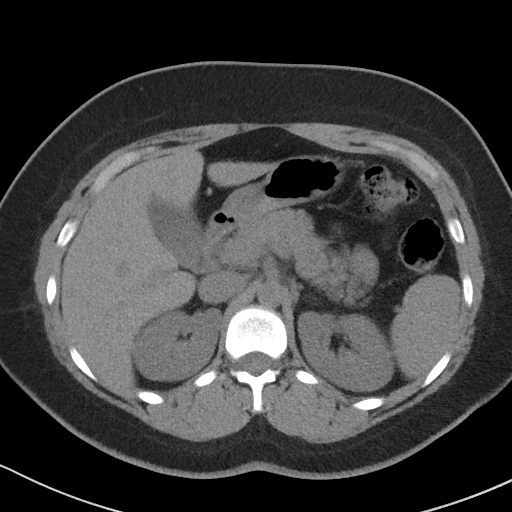
[im 75/94  soft-tissue]
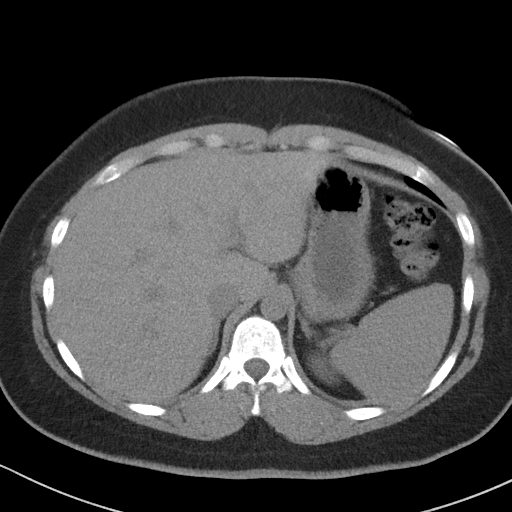
[im 82/94  soft-tissue]
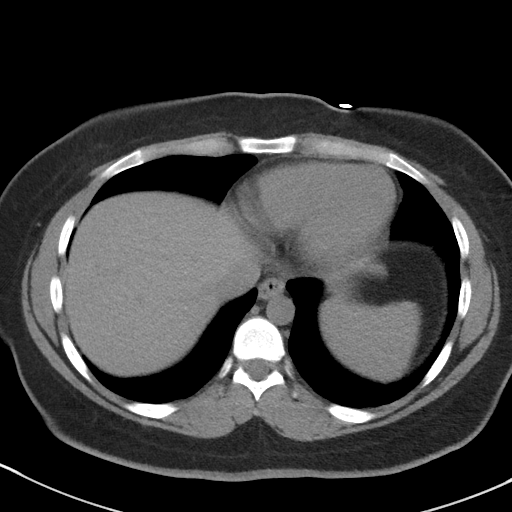
[im 90/94  soft-tissue]
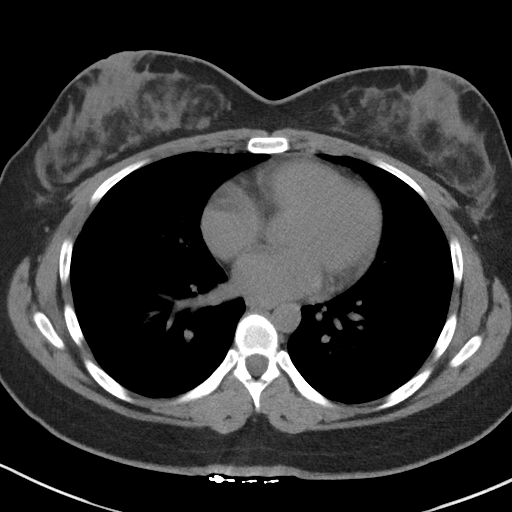

[Series 5: coronal st · coronal · 0.73mm/px · 3 of 89 slices shown]
[im 30/89  soft-tissue]
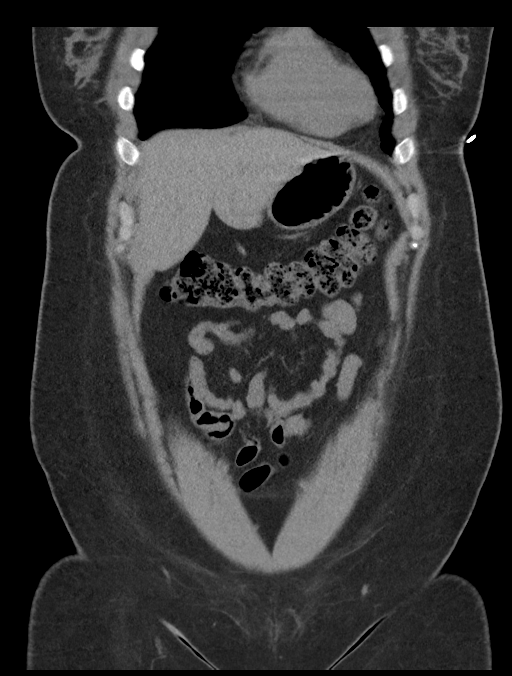
[im 40/89  soft-tissue]
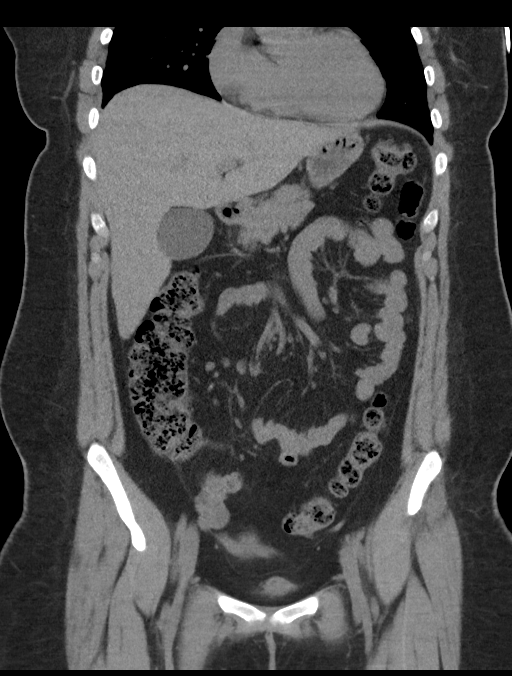
[im 49/89  soft-tissue]
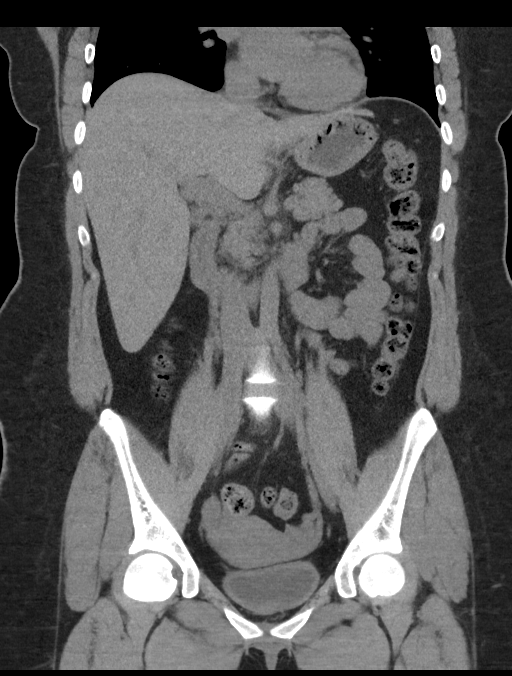

[16 of 46 positions shown; findings below may reference images not displayed]

FINDINGS: Lower chest: No acute abnormality.

Hepatobiliary: No focal liver abnormality is seen. No gallstones,
gallbladder wall thickening, or biliary dilatation.

Pancreas: Unremarkable. No pancreatic ductal dilatation or
surrounding inflammatory changes.

Spleen: Normal in size without focal abnormality.

Adrenals/Urinary Tract: Adrenal glands are unremarkable. Kidneys are
normal, without renal calculi, focal lesion, or hydronephrosis.
Bladder is unremarkable.

Stomach/Bowel: Stomach is within normal limits. Appendix appears
normal. No evidence of bowel wall thickening, distention, or
inflammatory changes.

Vascular/Lymphatic: Aortic atherosclerosis. No enlarged abdominal or
pelvic lymph nodes.

Reproductive: Uterus and bilateral adnexa are unremarkable.

Other: No abdominal wall hernia or abnormality. No abdominopelvic
ascites.

Musculoskeletal: No acute or significant osseous findings.
IMPRESSION: No evidence of acute abnormality within the abdomen or pelvis.

No evidence of appendicitis.

No evidence of obstructive uropathy.

## 2017-08-26 IMAGING — US US PELVIS COMPLETE
1 series · 13 of 25 positions shown · non-contrast
Comparison: 03/29/2015, prenatal ultrasound.

CLINICAL DATA: Vaginal bleeding for 1 month.

EXAM:
TRANSABDOMINAL AND TRANSVAGINAL ULTRASOUND OF PELVIS
DOPPLER ULTRASOUND OF OVARIES
TECHNIQUE: Both transabdominal and transvaginal ultrasound examinations of the
pelvis were performed. Transabdominal technique was performed for
global imaging of the pelvis including uterus, ovaries, adnexal
regions, and pelvic cul-de-sac.
It was necessary to proceed with endovaginal exam following the
transabdominal exam to visualize the endometrium. Color and duplex
Doppler ultrasound was utilized to evaluate blood flow to the
ovaries.

[Series 1: us pelvis complete · 0.21mm/px · 13 of 116 slices shown]
[im 1/116]
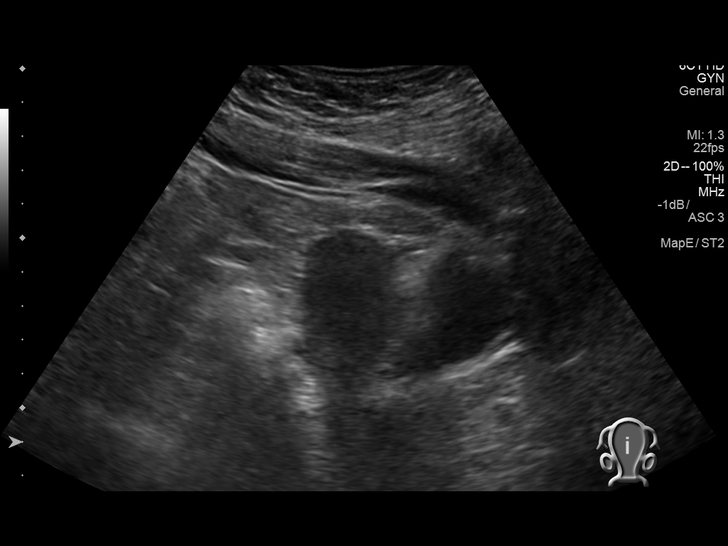
[im 10/116]
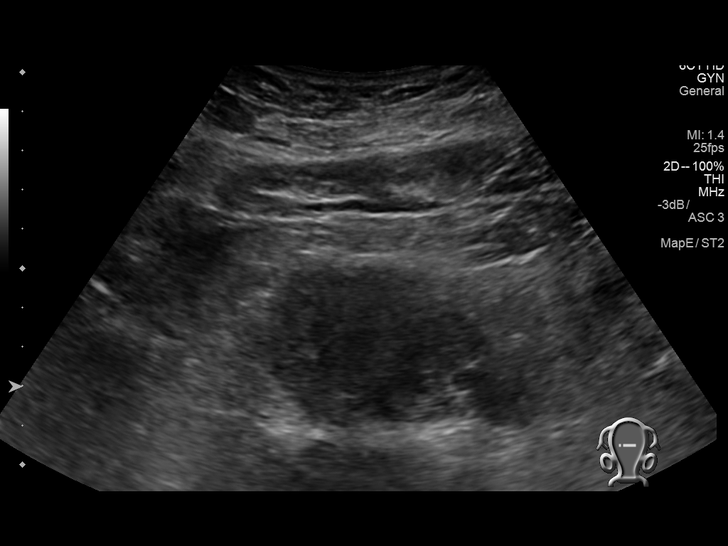
[im 20/116]
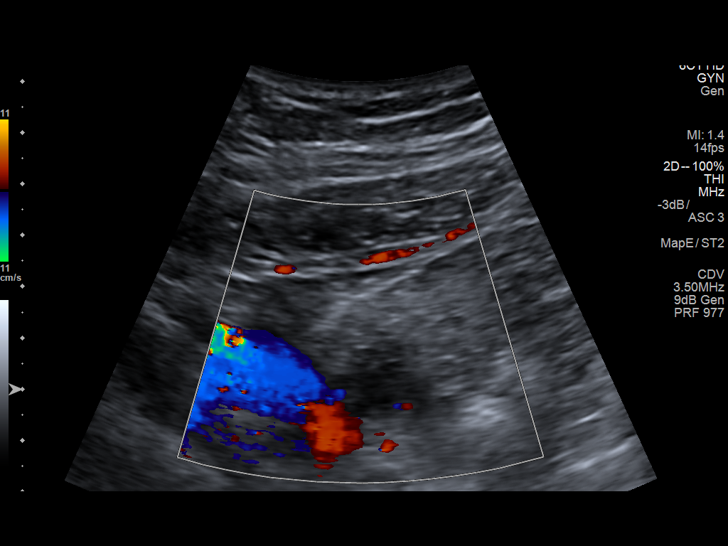
[im 29/116]
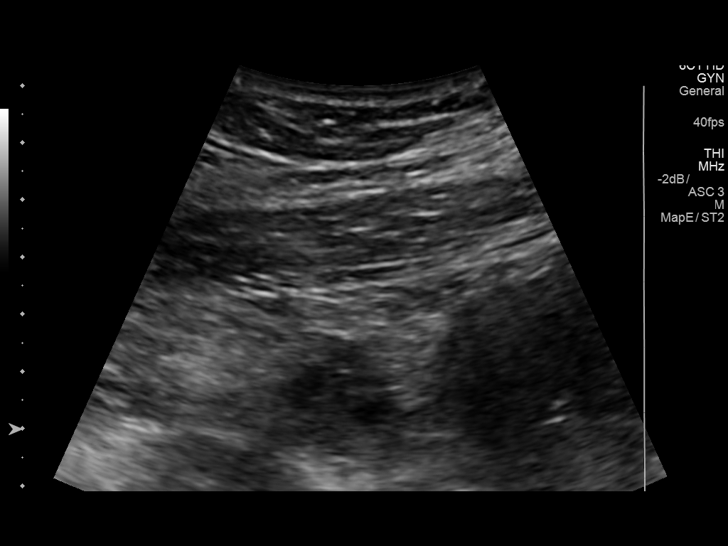
[im 39/116]
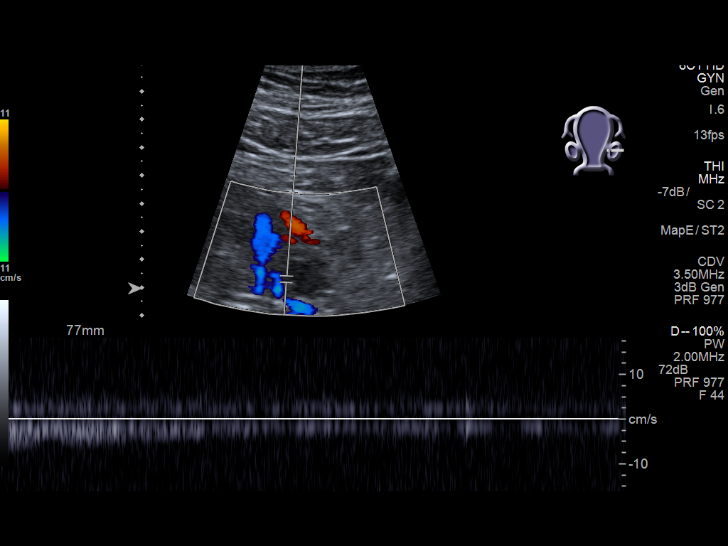
[im 48/116]
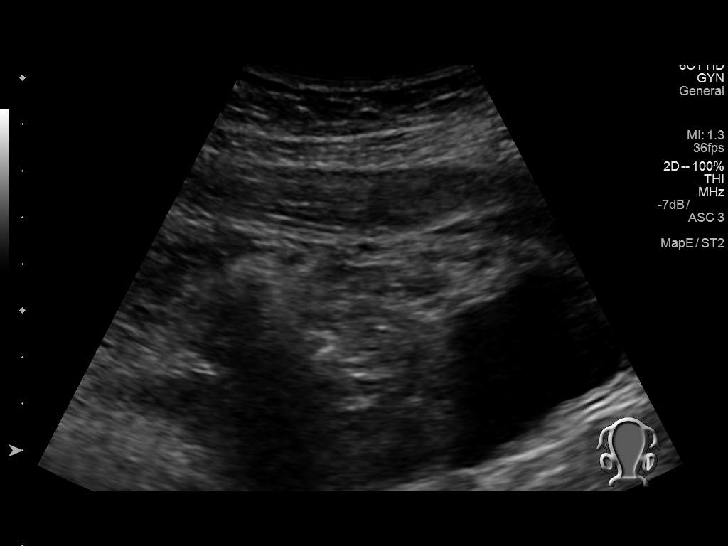
[im 58/116]
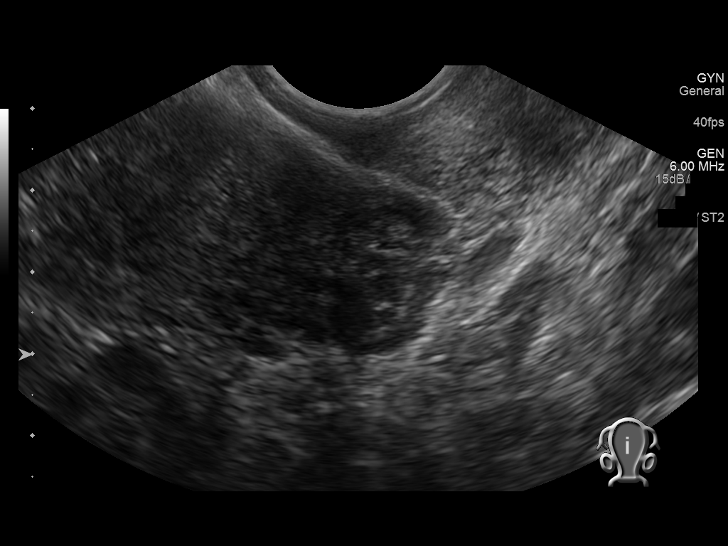
[im 68/116]
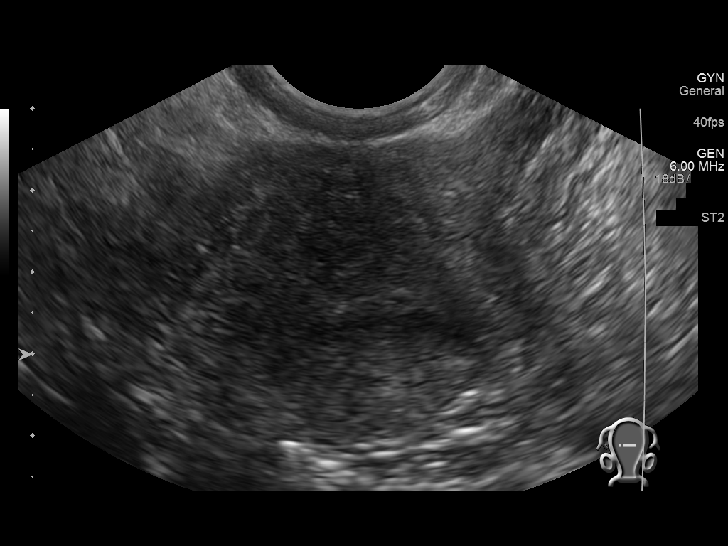
[im 77/116]
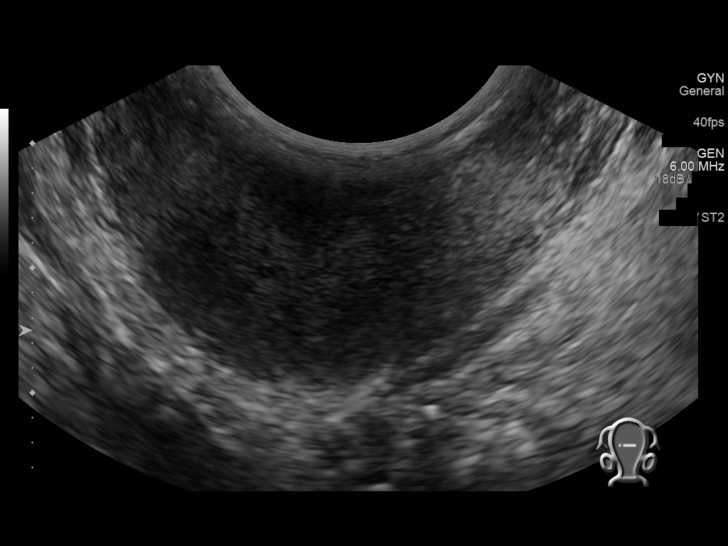
[im 87/116]
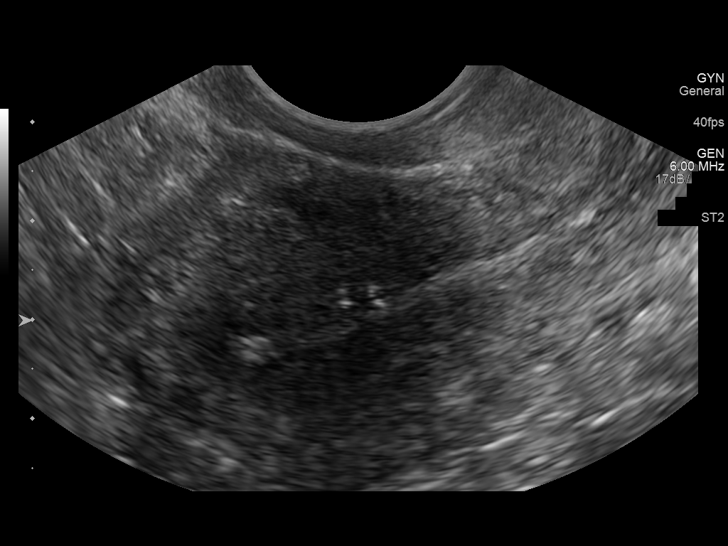
[im 96/116]
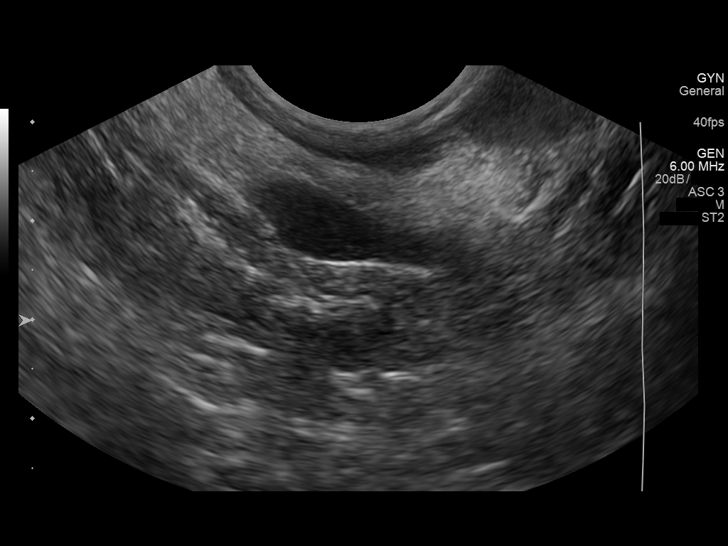
[im 106/116]
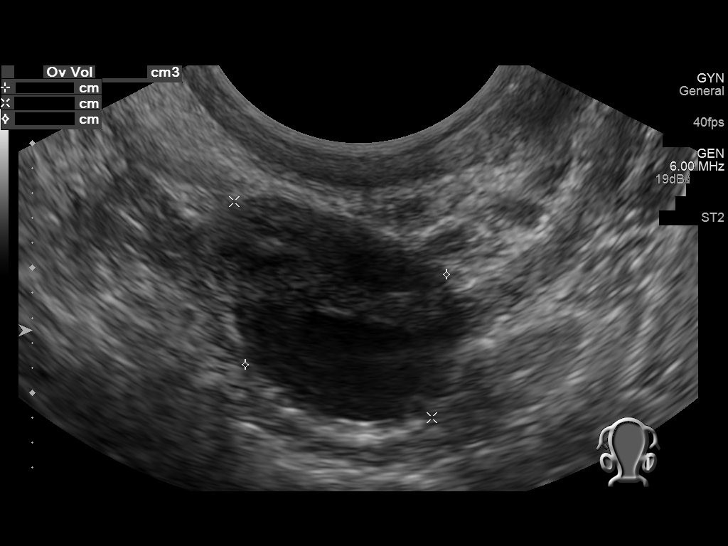
[im 116/116]
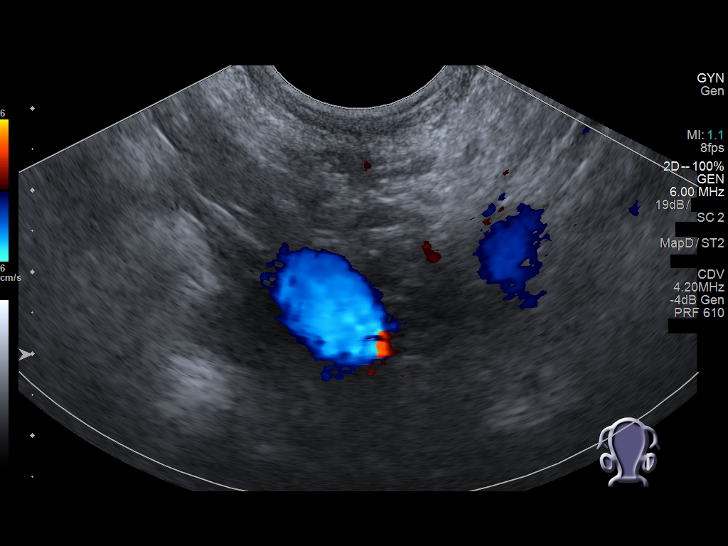

[13 of 25 positions shown; findings below may reference images not displayed]

FINDINGS: Uterus

Measurements: 7.4 x 3.8 x 4.4 cm. No fibroids or other mass
visualized.

Endometrium

Thickness: 2.8 mm. Few punctate calcifications are seen within the
endometrial canal versus the transitional zone in the body of the
uterus. Small amount of fluid within the lower endometrial canal,
consistent with menstrual state.

Right ovary

Measurements: 3.2 x 1.8 x 1.7 cm. Normal appearance/no adnexal mass.

Left ovary

Measurements: 2.3 x 1.8 x 2.3 cm. Normal appearance/no adnexal mass.

Pulsed Doppler evaluation of both ovaries demonstrates normal
low-resistance arterial and venous waveforms.

Other findings

No abnormal free fluid.
IMPRESSION: Normal appearance of bilateral ovaries.

Few punctate calcifications within the endometrium versus the
transitional zone in the body of the uterus, nonspecific. Otherwise
normal appearance of the uterus. Please correlate clinically.

## 2017-11-18 NOTE — L&D Delivery Note (Signed)
Delivery Note At  a viable female was delivered via  (Presentation:vertex ; LOA ).  APGAR:9 ,9 ; weight  .   Placenta status:spont  , shultz.  Cord:3vc  with the following complications: none.  Cord pH: n/a  Anesthesia:  none Episiotomy:  none Lacerations:  none Suture Repair: n/a Est. Blood Loss 100(mL):    Mom to postpartum.  Baby to Couplet care / Skin to Skin.  Koren Shiver 04/19/2018, 11:31 AM

## 2017-11-24 ENCOUNTER — Other Ambulatory Visit: Payer: Self-pay | Admitting: Obstetrics & Gynecology

## 2017-11-24 DIAGNOSIS — Z363 Encounter for antenatal screening for malformations: Secondary | ICD-10-CM

## 2017-11-25 ENCOUNTER — Encounter (INDEPENDENT_AMBULATORY_CARE_PROVIDER_SITE_OTHER): Payer: Self-pay

## 2017-11-25 ENCOUNTER — Ambulatory Visit (INDEPENDENT_AMBULATORY_CARE_PROVIDER_SITE_OTHER): Payer: Self-pay

## 2017-11-25 DIAGNOSIS — Z3A18 18 weeks gestation of pregnancy: Secondary | ICD-10-CM

## 2017-11-25 DIAGNOSIS — Z363 Encounter for antenatal screening for malformations: Secondary | ICD-10-CM

## 2017-11-25 NOTE — Progress Notes (Signed)
[place] 18 wks,breech,anterior pl gr 0,normal ovaries bilat,cx 4 cm,svp of fluid 4.4 cm,fhr 153 bpm,efw 218 g,limited view of spine because of fetal position,please have pt come back for f/u ultrasound,no obvious abnormalities

## 2017-12-08 ENCOUNTER — Other Ambulatory Visit (INDEPENDENT_AMBULATORY_CARE_PROVIDER_SITE_OTHER): Payer: Self-pay

## 2017-12-08 ENCOUNTER — Ambulatory Visit (INDEPENDENT_AMBULATORY_CARE_PROVIDER_SITE_OTHER): Payer: Self-pay | Admitting: Women's Health

## 2017-12-08 ENCOUNTER — Encounter: Payer: Self-pay | Admitting: Women's Health

## 2017-12-08 ENCOUNTER — Ambulatory Visit: Payer: Self-pay | Admitting: *Deleted

## 2017-12-08 VITALS — BP 90/58 | HR 72 | Wt 174.0 lb

## 2017-12-08 DIAGNOSIS — O99891 Other specified diseases and conditions complicating pregnancy: Secondary | ICD-10-CM

## 2017-12-08 DIAGNOSIS — O9989 Other specified diseases and conditions complicating pregnancy, childbirth and the puerperium: Secondary | ICD-10-CM

## 2017-12-08 DIAGNOSIS — Z3A19 19 weeks gestation of pregnancy: Secondary | ICD-10-CM

## 2017-12-08 DIAGNOSIS — Z3490 Encounter for supervision of normal pregnancy, unspecified, unspecified trimester: Secondary | ICD-10-CM | POA: Insufficient documentation

## 2017-12-08 DIAGNOSIS — Z23 Encounter for immunization: Secondary | ICD-10-CM

## 2017-12-08 DIAGNOSIS — Z3482 Encounter for supervision of other normal pregnancy, second trimester: Secondary | ICD-10-CM

## 2017-12-08 DIAGNOSIS — Z362 Encounter for other antenatal screening follow-up: Secondary | ICD-10-CM

## 2017-12-08 DIAGNOSIS — Z1379 Encounter for other screening for genetic and chromosomal anomalies: Secondary | ICD-10-CM

## 2017-12-08 DIAGNOSIS — Z331 Pregnant state, incidental: Secondary | ICD-10-CM

## 2017-12-08 DIAGNOSIS — R8271 Bacteriuria: Secondary | ICD-10-CM | POA: Insufficient documentation

## 2017-12-08 DIAGNOSIS — O0932 Supervision of pregnancy with insufficient antenatal care, second trimester: Secondary | ICD-10-CM

## 2017-12-08 DIAGNOSIS — Z1389 Encounter for screening for other disorder: Secondary | ICD-10-CM

## 2017-12-08 LAB — POCT URINALYSIS DIPSTICK
Blood, UA: NEGATIVE
Glucose, UA: NEGATIVE
KETONES UA: NEGATIVE
Leukocytes, UA: NEGATIVE
NITRITE UA: POSITIVE
PROTEIN UA: NEGATIVE

## 2017-12-08 MED ORDER — NITROFURANTOIN MONOHYD MACRO 100 MG PO CAPS: 100.0000 mg | ORAL_CAPSULE | Freq: Two times a day (BID) | ORAL | 0 refills | Status: DC

## 2017-12-08 NOTE — Progress Notes (Signed)
INITIAL OBSTETRICAL VISIT Patient name: Ashlee Vincent MRN [id number]  Date of birth: 11/19/1995 Chief Complaint:   Initial Prenatal Visit (knot on left breast)  History of Present Illness:   Ashlee Vincent is a 22 y.o. [id number] Hispanic female at [redacted]w[redacted]d by 18wk u/s, with an Estimated Date of Delivery: 04/28/18 being seen today for her initial obstetrical visit.   Her obstetrical history is significant for term uncomplicated svb x 2, sab x 2.   Today she reports tender knot Lt breast x 2 months, feels it is getting bigger. Does drink caffeine. Doesn't smoke. Only taking pnv. Had anatomy scan @ 18wks, limited views spine. Thought she was having repeat u/s today, but is not scheduled as such.  Patient's last menstrual period was 05/16/2017 (lmp unknown). Last pap needs- wants to wait until next visit d/t 11yo daughter being w/ her today. Results were: n/a Review of Systems:   Pertinent items are noted in HPI Denies cramping/contractions, leakage of fluid, vaginal bleeding, abnormal vaginal discharge w/ itching/odor/irritation, headaches, visual changes, shortness of breath, chest pain, abdominal pain, severe nausea/vomiting, or problems with urination or bowel movements unless otherwise stated above.  Pertinent History Reviewed:  Reviewed past medical,surgical, social, obstetrical and family history.  Reviewed problem list, medications and allergies. OB History  Gravida Para Term Preterm AB Living  5 2 2  0 2 2  SAB TAB Ectopic Multiple Live Births  2 0 0 0 2    # Outcome Date GA Lbr Len/2nd Weight Sex Delivery Anes PTL Lv  5 Current           4 Term 08/14/15 [redacted]w[redacted]d 18:30 / 00:16 7 lb 8.8 oz (3.425 kg) F Vag-Spont Local N LIV     Birth Comments: WNL  3 SAB 06/27/14 108w3d            Birth Comments: System Generated. Please review and update pregnancy details.  2 SAB 02/16/14          1 Term 02/21/10 [redacted]w[redacted]d  7 lb 14 oz (3.572 kg) F Vag-Spont None N LIV     Physical Assessment:   Vitals:     12/08/17 1508  BP: (!) 90/58  Pulse: 72  Weight: 174 lb (78.9 kg)  Body mass index is 35.14 kg/m.       Physical Examination:  General appearance - well appearing, and in no distress  Mental status - alert, oriented to person, place, and time  Psych:  She has a normal mood and affect  Skin - warm and dry, normal color, no suspicious lesions noted  Chest - effort normal, all lung fields clear to auscultation bilaterally  Heart - normal rate and regular rhythm  Breasts - breasts appear normal, no suspicious masses, no skin or nipple changes or axillary nodes, does have tenderness at 3 o'clock Lt breast- I do not feel a discrete knot/lump   Abdomen - soft, nontender  Extremities:  No swelling or varicosities noted  Pelvic - VULVA: normal appearing vulva with no masses, tenderness or lesions  VAGINA: normal appearing vagina with normal color and discharge, no lesions  CERVIX: normal appearing cervix without discharge or lesions, no CMT  Thin prep pap is not done  Amber had available u/s, so worked in for f/u anatomy scan FHR + via u/s [place] 19+6 wks,breech,cx 3.5 cm,anterior pl gr 0,svp of fluid 4 cm,fhr 148 bpm,bilat adnexa's wnl,efw 318 g,anatomy of spine complete,no obvious abnormalities   Results for orders placed or performed in visit on  12/08/17 (from the past 24 hour(s))  POCT urinalysis dipstick   Collection Time: 12/08/17  3:36 PM  Result Value Ref Range   Color, UA     Clarity, UA     Glucose, UA neg    Bilirubin, UA     Ketones, UA neg    Spec Grav, UA  1.010 - 1.025   Blood, UA neg    pH, UA  5.0 - 8.0   Protein, UA neg    Urobilinogen, UA  0.2 or 1.0 E.U./dL   Nitrite, UA positive    Leukocytes, UA Negative Negative   Appearance     Odor      Assessment & Plan:  1) Low-Risk Pregnancy [id number] at [redacted]w[redacted]d with an Estimated Date of Delivery: 04/28/18   2) Initial OB visit  3) Pain Lt breast> cut out all caffeine, let us know if doesn't improve or if worsens, will  consider u/s  4) Late pnc at [redacted]w[redacted]d   5) ASB> rx macrobid, send urine cx   Meds: No orders of the defined types were placed in this encounter.   Initial labs obtained Continue prenatal vitamins Reviewed n/v relief measures and warning s/s to report Reviewed recommended weight gain based on pre-gravid BMI Encouraged well-balanced diet Genetic Screening discussed Quad Screen: requested Cystic fibrosis screening discussed requested Ultrasound discussed; fetal survey: results reviewed CCNC completed>PCM not here Flu shot today  Follow-up: Return for 4wks for LROB w/ pap.   Orders Placed This Encounter  Procedures  . Urine Culture  . GC/Chlamydia Probe Amp  . US OB Follow Up  . Flu Vaccine QUAD 36+ mos IM  . Obstetric Panel, Including HIV  . Urinalysis, Routine w reflex microscopic  . Cystic Fibrosis Mutation 97  . AFP TETRA  . Pain Management Screening Profile (10S)  . POCT urinalysis dipstick    Tawnya Crook CNM, [facility] 12/08/2017 3:44 PM

## 2017-12-08 NOTE — Patient Instructions (Signed)
Ashlee Vincent, I greatly value your feedback.  If you receive a survey following your visit with [place] today, we appreciate you taking the time to fill it out.  Thanks, [name], CNM, WHNP-BC   Second Trimester of Pregnancy The second trimester is from week 14 through week 27 (months 4 through 6). The second trimester is often a time when you feel your best. Your body has adjusted to being pregnant, and you begin to feel better physically. Usually, morning sickness has lessened or quit completely, you may have more energy, and you may have an increase in appetite. The second trimester is also a time when the fetus is growing rapidly. At the end of the sixth month, the fetus is about 9 inches long and weighs about 1 pounds. You will likely begin to feel the baby move (quickening) between 16 and 20 weeks of pregnancy. Body changes during your second trimester Your body continues to go through many changes during your second trimester. The changes vary from woman to woman.  Your weight will continue to increase. You will notice your lower abdomen bulging out.  You may begin to get stretch marks on your hips, abdomen, and breasts.  You may develop headaches that can be relieved by medicines. The medicines should be approved by your health care provider.  You may urinate more often because the fetus is pressing on your bladder.  You may develop or continue to have heartburn as a result of your pregnancy.  You may develop constipation because certain hormones are causing the muscles that push waste through your intestines to slow down.  You may develop hemorrhoids or swollen, bulging veins (varicose veins).  You may have back pain. This is caused by: ? Weight gain. ? Pregnancy hormones that are relaxing the joints in your pelvis. ? A shift in weight and the muscles that support your balance.  Your breasts will continue to grow and they will continue to become tender.  Your gums may bleed  and may be sensitive to brushing and flossing.  Dark spots or blotches (chloasma, mask of pregnancy) may develop on your face. This will likely fade after the baby is born.  A dark line from your belly button to the pubic area (linea nigra) may appear. This will likely fade after the baby is born.  You may have changes in your hair. These can include thickening of your hair, rapid growth, and changes in texture. Some women also have hair loss during or after pregnancy, or hair that feels dry or thin. Your hair will most likely return to normal after your baby is born.  What to expect at prenatal visits During a routine prenatal visit:  You will be weighed to make sure you and the fetus are growing normally.  Your blood pressure will be taken.  Your abdomen will be measured to track your baby's growth.  The fetal heartbeat will be listened to.  Any test results from the previous visit will be discussed.  Your health care provider may ask you:  How you are feeling.  If you are feeling the baby move.  If you have had any abnormal symptoms, such as leaking fluid, bleeding, severe headaches, or abdominal cramping.  If you are using any tobacco products, including cigarettes, chewing tobacco, and electronic cigarettes.  If you have any questions.  Other tests that may be performed during your second trimester include:  Blood tests that check for: ? Low iron levels (anemia). ? High blood  sugar that affects pregnant women (gestational diabetes) between 55 and 28 weeks. ? Rh antibodies. This is to check for a protein on red blood cells (Rh factor).  Urine tests to check for infections, diabetes, or protein in the urine.  An ultrasound to confirm the proper growth and development of the baby.  An amniocentesis to check for possible genetic problems.  Fetal screens for spina bifida and Down syndrome.  HIV (human immunodeficiency virus) testing. Routine prenatal testing includes  screening for HIV, unless you choose not to have this test.  Follow these instructions at home: Medicines  Follow your health care provider's instructions regarding medicine use. Specific medicines may be either safe or unsafe to take during pregnancy.  Take a prenatal vitamin that contains at least 600 micrograms (mcg) of folic acid.  If you develop constipation, try taking a stool softener if your health care provider approves. Eating and drinking  Eat a balanced diet that includes fresh fruits and vegetables, whole grains, good sources of protein such as meat, eggs, or tofu, and low-fat dairy. Your health care provider will help you determine the amount of weight gain that is right for you.  Avoid raw meat and uncooked cheese. These carry germs that can cause birth defects in the baby.  If you have low calcium intake from food, talk to your health care provider about whether you should take a daily calcium supplement.  Limit foods that are high in fat and processed sugars, such as fried and sweet foods.  To prevent constipation: ? Drink enough fluid to keep your urine clear or pale yellow. ? Eat foods that are high in fiber, such as fresh fruits and vegetables, whole grains, and beans. Activity  Exercise only as directed by your health care provider. Most women can continue their usual exercise routine during pregnancy. Try to exercise for 30 minutes at least 5 days a week. Stop exercising if you experience uterine contractions.  Avoid heavy lifting, wear low heel shoes, and practice good posture.  A sexual relationship may be continued unless your health care provider directs you otherwise. Relieving pain and discomfort  Wear a good support bra to prevent discomfort from breast tenderness.  Take warm sitz baths to soothe any pain or discomfort caused by hemorrhoids. Use hemorrhoid cream if your health care provider approves.  Rest with your legs elevated if you have leg cramps  or low back pain.  If you develop varicose veins, wear support hose. Elevate your feet for 15 minutes, 3-4 times a day. Limit salt in your diet. Prenatal Care  Write down your questions. Take them to your prenatal visits.  Keep all your prenatal visits as told by your health care provider. This is important. Safety  Wear your seat belt at all times when driving.  Make a list of emergency phone numbers, including numbers for family, friends, the hospital, and police and fire departments. General instructions  Ask your health care provider for a referral to a local prenatal education class. Begin classes no later than the beginning of month 6 of your pregnancy.  Ask for help if you have counseling or nutritional needs during pregnancy. Your health care provider can offer advice or refer you to specialists for help with various needs.  Do not use hot tubs, steam rooms, or saunas.  Do not douche or use tampons or scented sanitary pads.  Do not cross your legs for long periods of time.  Avoid cat litter boxes and soil used  by cats. These carry germs that can cause birth defects in the baby and possibly loss of the fetus by miscarriage or stillbirth.  Avoid all smoking, herbs, alcohol, and unprescribed drugs. Chemicals in these products can affect the formation and growth of the baby.  Do not use any products that contain nicotine or tobacco, such as cigarettes and e-cigarettes. If you need help quitting, ask your health care provider.  Visit your dentist if you have not gone yet during your pregnancy. Use a soft toothbrush to brush your teeth and be gentle when you floss. Contact a health care provider if:  You have dizziness.  You have mild pelvic cramps, pelvic pressure, or nagging pain in the abdominal area.  You have persistent nausea, vomiting, or diarrhea.  You have a bad smelling vaginal discharge.  You have pain when you urinate. Get help right away if:  You have a  fever.  You are leaking fluid from your vagina.  You have spotting or bleeding from your vagina.  You have severe abdominal cramping or pain.  You have rapid weight gain or weight loss.  You have shortness of breath with chest pain.  You notice sudden or extreme swelling of your face, hands, ankles, feet, or legs.  You have not felt your baby move in over an hour.  You have severe headaches that do not go away when you take medicine.  You have vision changes. Summary  The second trimester is from week 14 through week 27 (months 4 through 6). It is also a time when the fetus is growing rapidly.  Your body goes through many changes during pregnancy. The changes vary from woman to woman.  Avoid all smoking, herbs, alcohol, and unprescribed drugs. These chemicals affect the formation and growth your baby.  Do not use any tobacco products, such as cigarettes, chewing tobacco, and e-cigarettes. If you need help quitting, ask your health care provider.  Contact your health care provider if you have any questions. Keep all prenatal visits as told by your health care provider. This is important. This information is not intended to replace advice given to you by your health care provider. Make sure you discuss any questions you have with your health care provider. Document Released: 10/29/2001 Document Revised: 04/11/2016 Document Reviewed: 01/05/2013 Elsevier Interactive Patient Education  2017 [employer].

## 2017-12-08 NOTE — Progress Notes (Signed)
[place] 19+6 wks,breech,cx 3.5 cm,anterior pl gr 0,svp of fluid 4 cm,fhr 148 bpm,bilat adnexa's wnl,efw 318 g,anatomy of spine complete,no obvious abnormalities

## 2017-12-09 LAB — PMP SCREEN PROFILE (10S), URINE
Amphetamine Scrn, Ur: NEGATIVE ng/mL
BARBITURATE SCREEN URINE: NEGATIVE ng/mL
BENZODIAZEPINE SCREEN, URINE: NEGATIVE ng/mL
CANNABINOIDS UR QL SCN: NEGATIVE ng/mL
COCAINE(METAB.)SCREEN, URINE: NEGATIVE ng/mL
Creatinine(Crt), U: 97 mg/dL (ref 20.0–300.0)
Methadone Screen, Urine: NEGATIVE ng/mL
OXYCODONE+OXYMORPHONE UR QL SCN: NEGATIVE ng/mL
Opiate Scrn, Ur: NEGATIVE ng/mL
Ph of Urine: 6.2 (ref 4.5–8.9)
Phencyclidine Qn, Ur: NEGATIVE ng/mL
Propoxyphene Scrn, Ur: NEGATIVE ng/mL

## 2017-12-10 LAB — URINE CULTURE: Organism ID, Bacteria: NO GROWTH

## 2017-12-16 LAB — OBSTETRIC PANEL, INCLUDING HIV
ANTIBODY SCREEN: NEGATIVE
BASOS: 0 %
Basophils Absolute: 0 10*3/uL (ref 0.0–0.2)
EOS (ABSOLUTE): 0.1 10*3/uL (ref 0.0–0.4)
EOS: 2 %
HEMATOCRIT: 40 % (ref 34.0–46.6)
HEP B S AG: NEGATIVE
HIV SCREEN 4TH GENERATION: NONREACTIVE
Hemoglobin: 13.8 g/dL (ref 11.1–15.9)
Immature Grans (Abs): 0 10*3/uL (ref 0.0–0.1)
Immature Granulocytes: 1 %
Lymphocytes Absolute: 1.6 10*3/uL (ref 0.7–3.1)
Lymphs: 22 %
MCH: 33 pg (ref 26.6–33.0)
MCHC: 34.5 g/dL (ref 31.5–35.7)
MCV: 96 fL (ref 79–97)
Monocytes Absolute: 0.5 10*3/uL (ref 0.1–0.9)
Monocytes: 6 %
NEUTROS ABS: 5.3 10*3/uL (ref 1.4–7.0)
Neutrophils: 69 %
PLATELETS: 241 10*3/uL (ref 150–379)
RBC: 4.18 x10E6/uL (ref 3.77–5.28)
RDW: 13.6 % (ref 12.3–15.4)
RH TYPE: POSITIVE
RPR: NONREACTIVE
Rubella Antibodies, IGG: 2.17 index (ref >0.99)
WBC: 7.5 10*3/uL (ref 3.4–10.8)

## 2017-12-16 LAB — MICROSCOPIC EXAMINATION: CASTS: NONE SEEN /LPF

## 2017-12-16 LAB — AFP TETRA
DIA MOM VALUE: 0.6
DIA VALUE (EIA): 103.4 pg/mL
DSR (BY AGE) 1 IN: 1116
DSR (Second Trimester) 1 IN: 10000
GESTATIONAL AGE AFP: 19.6 wk
MSAFP Mom: 1.25
MSAFP: 59.2 ng/mL
MSHCG Mom: 0.6
MSHCG: 12946 m[IU]/mL
Maternal Age At EDD: 22.5 yr
Osb Risk: 5576
TEST RESULTS AFP: NEGATIVE
WEIGHT: 174 [lb_av]
uE3 Mom: 0.96
uE3 Value: 1.58 ng/mL

## 2017-12-16 LAB — CYSTIC FIBROSIS MUTATION 97: Interpretation: NOT DETECTED

## 2017-12-16 LAB — URINALYSIS, ROUTINE W REFLEX MICROSCOPIC
BILIRUBIN UA: NEGATIVE
Glucose, UA: NEGATIVE
Ketones, UA: NEGATIVE
LEUKOCYTES UA: NEGATIVE
Nitrite, UA: POSITIVE — AB
PH UA: 6.5 (ref 5.0–7.5)
PROTEIN UA: NEGATIVE
RBC UA: NEGATIVE
Specific Gravity, UA: 1.02 (ref 1.005–1.030)
Urobilinogen, Ur: 0.2 mg/dL (ref 0.2–1.0)

## 2018-01-07 ENCOUNTER — Ambulatory Visit (INDEPENDENT_AMBULATORY_CARE_PROVIDER_SITE_OTHER): Payer: Self-pay | Admitting: Advanced Practice Midwife

## 2018-01-07 ENCOUNTER — Other Ambulatory Visit (HOSPITAL_COMMUNITY)
Admission: RE | Admit: 2018-01-07 | Discharge: 2018-01-07 | Disposition: A | Discharge status: Home / Self Care | Payer: Self-pay | Source: Ambulatory Visit | Attending: Advanced Practice Midwife | Admitting: Advanced Practice Midwife

## 2018-01-07 VITALS — BP 102/60 | HR 80 | Wt 178.0 lb

## 2018-01-07 DIAGNOSIS — Z3482 Encounter for supervision of other normal pregnancy, second trimester: Secondary | ICD-10-CM | POA: Insufficient documentation

## 2018-01-07 DIAGNOSIS — Z01419 Encounter for gynecological examination (general) (routine) without abnormal findings: Secondary | ICD-10-CM

## 2018-01-07 DIAGNOSIS — Z3A24 24 weeks gestation of pregnancy: Secondary | ICD-10-CM

## 2018-01-07 DIAGNOSIS — Z331 Pregnant state, incidental: Secondary | ICD-10-CM

## 2018-01-07 DIAGNOSIS — Z1389 Encounter for screening for other disorder: Secondary | ICD-10-CM

## 2018-01-07 LAB — POCT URINALYSIS DIPSTICK
GLUCOSE UA: NEGATIVE
Ketones, UA: NEGATIVE
Leukocytes, UA: NEGATIVE
Nitrite, UA: NEGATIVE
Protein, UA: NEGATIVE
RBC UA: NEGATIVE

## 2018-01-07 NOTE — Patient Instructions (Addendum)
1. Before your test, do not eat or drink anything for 8-10 hours prior to your  appointment (a small amount of water is allowed and you may take any medicines you normally take). Be sure to drink lots of water the day before. 2. When you arrive, your blood will be drawn for a 'fasting' blood sugar level.  Then you will be given a sweetened carbonated beverage to drink. You should  complete drinking this beverage within five minutes. After finishing the  beverage, you will have your blood drawn exactly 1 and 2 hours later. Having  your blood drawn on time is an important part of this test. A total of three blood  samples will be done. 3. The test takes approximately 2  hours. During the test, do not have anything to  eat or drink. Do not smoke, chew gum (not even sugarless gum) or use breath mints.  4. During the test you should remain close by and seated as much as possible and  avoid walking around. You may want to bring a book or something else to  occupy your time.  5. After your test, you may eat and drink as normal. You may want to bring a snack  to eat after the test is finished. Your provider will advise you as to the results of  this test and any follow-up if necessary  If your sugar test is positive for gestational diabetes, you will be given an phone call and further instructions discussed. If you wish to know all of your test results before your next appointment, feel free to call the office, or look up your test results on Mychart.  (The range that the lab uses for normal values of the sugar test are not necessarily the range that is used for pregnant women; if your results are within the normal range, they are definitely normal.  However, if a value is deemed "high" by the lab, it may not be too high for a pregnant woman.  We will need to discuss the results if your value(s) fall in the "high" category).     Tdap Vaccine  It is recommended that you get the Tdap vaccine during the  third trimester of EACH pregnancy to help protect your baby from getting pertussis (whooping cough)  27-36 weeks is the BEST time to do this so that you can pass the protection on to your baby. During pregnancy is better than after pregnancy, but if you are unable to get it during pregnancy it will be offered at the hospital.  You can get this vaccine at the health department or your family doctor, as well as some pharmacies.  Everyone who will be around your baby should also be up-to-date on their vaccines. Adults (who are not pregnant) only need 1 dose of Tdap during adulthood.   Kinesiology taping for pregnancy:  Youtube has good vidoes of "how tos" for lower back, pelvic, hip pain; swelling of feet, etc   Varicose veins in vagina

## 2018-01-07 NOTE — Progress Notes (Signed)
[id number] [redacted]w[redacted]d Estimated Date of Delivery: 04/28/18  Blood pressure 102/60, pulse 80, weight 178 lb (80.7 kg), last menstrual period 05/16/2017, unknown if currently breastfeeding.   BP weight and urine results all reviewed and noted.  Please refer to the obstetrical flow sheet for the fundal height and fetal heart rate documentation:  Patient reports good fetal movement, denies any bleeding and no rupture of membranes symptoms or regular contractions. Patient is without complaints. Says noticed "squigly things" on her vulva following a yeast infection after her last delivery, but they are still there  Has some varicose veins on vulva, pt reassured All questions were answered.  Orders Placed This Encounter  Procedures  . POCT Urinalysis Dipstick    Plan:  Continued routine obstetrical care, pap/gcchl today  Return in about 3 weeks (around 01/28/2018) for PN2/LROB.

## 2018-01-08 LAB — CYTOLOGY - PAP
CHLAMYDIA, DNA PROBE: NEGATIVE
Diagnosis: NEGATIVE
Neisseria Gonorrhea: NEGATIVE

## 2018-01-28 ENCOUNTER — Other Ambulatory Visit: Payer: Self-pay

## 2018-01-28 ENCOUNTER — Encounter: Payer: Self-pay | Admitting: Obstetrics and Gynecology

## 2018-01-28 ENCOUNTER — Ambulatory Visit (INDEPENDENT_AMBULATORY_CARE_PROVIDER_SITE_OTHER): Payer: Self-pay | Admitting: Obstetrics and Gynecology

## 2018-01-28 VITALS — BP 108/66 | HR 84 | Wt 180.8 lb

## 2018-01-28 DIAGNOSIS — Z131 Encounter for screening for diabetes mellitus: Secondary | ICD-10-CM

## 2018-01-28 DIAGNOSIS — Z3A27 27 weeks gestation of pregnancy: Secondary | ICD-10-CM

## 2018-01-28 DIAGNOSIS — Z3482 Encounter for supervision of other normal pregnancy, second trimester: Secondary | ICD-10-CM

## 2018-01-28 DIAGNOSIS — Z331 Pregnant state, incidental: Secondary | ICD-10-CM

## 2018-01-28 DIAGNOSIS — Z1389 Encounter for screening for other disorder: Secondary | ICD-10-CM

## 2018-01-28 LAB — POCT URINALYSIS DIPSTICK
Blood, UA: NEGATIVE
GLUCOSE UA: NEGATIVE
KETONES UA: NEGATIVE
Leukocytes, UA: NEGATIVE
Nitrite, UA: NEGATIVE
Protein, UA: NEGATIVE

## 2018-01-28 NOTE — Progress Notes (Signed)
   LOW-RISK PREGNANCY VISIT Patient name: Ashlee Vincent MRN [id number]  Date of birth: May 05, 1996 Chief Complaint:   Routine Prenatal Visit (PN2 today)  History of Present Illness:   Ashlee Vincent is a 22 y.o. [id number] female at [redacted]w[redacted]d with an Estimated Date of Delivery: 04/28/18 being seen today for ongoing management of a low-risk pregnancy.  Today she reports no complaints. 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 She noted they were swollen when she had her yeast infection. Discussed.. Contractions: Not present. Vag. Bleeding: None.  Movement: Present. denies leaking of fluid. Review of Systems:   Pertinent items are noted in HPI Denies abnormal vaginal discharge w/ itching/odor/irritation, headaches, visual changes, shortness of breath, chest pain, abdominal pain, severe nausea/vomiting, or problems with urination or bowel movements unless otherwise stated above. Pertinent History Reviewed:  Reviewed past medical,surgical, social, obstetrical and family history.  Reviewed problem list, medications and allergies. Physical Assessment:   Vitals:   01/28/18 0934  BP: 108/66  Pulse: 84  Weight: 180 lb 12.8 oz (82 kg)  Body mass index is 36.52 kg/m.        Physical Examination:   General appearance: Well appearing, and in no distress  Mental status: Alert, oriented to person, place, and time  Skin: Warm & dry  Cardiovascular: Normal heart rate noted  Respiratory: Normal respiratory effort, no distress  Abdomen: Soft, gravid, nontender  Pelvic: Cervical exam deferred         Extremities: Edema: None  Fetal Status: Fetal Heart Rate (bpm): 143 Fundal Height: 29 cm Movement: Present    Results for orders placed or performed in visit on 01/28/18 (from the past 24 hour(s))  POCT urinalysis dipstick   Collection Time: 01/28/18  9:35 AM  Result Value Ref Range   Color, UA     Clarity, UA     Glucose, UA neg    Bilirubin, UA     Ketones, UA neg    Spec Grav, UA  1.010 - 1.025   Blood, UA neg    pH, UA  5.0 - 8.0   Protein, UA neg    Urobilinogen, UA  0.2 or 1.0 E.U./dL   Nitrite, UA neg    Leukocytes, UA Negative Negative   Appearance     Odor      Assessment & Plan:  1) Low-risk pregnancy [id number] at [redacted]w[redacted]d with an Estimated Date of Delivery: 04/28/18    Meds: No orders of the defined types were placed in this encounter.  Labs/procedures today: PN2  Plan:  Continue routine obstetrical care  Reviewed: Preterm labor symptoms and general obstetric precautions including but not limited to vaginal bleeding, contractions, leaking of fluid and fetal movement were reviewed in detail with the patient.  All questions were answered  Follow-up: Return in about 4 weeks (around 02/25/2018), or if symptoms worsen or fail to improve, for LROB.  Orders Placed This Encounter  Procedures  . POCT urinalysis dipstick      By signing my name below, I, Izna Ahmed, attest that this documentation has been prepared under the direction and in the presence of Jonnie Kind, MD. Electronically Signed: Jabier Gauss, Medical Scribe. 01/28/18. 10:14 AM.  I personally performed the services described in this documentation, which was SCRIBED in my presence. The recorded information has been reviewed and considered accurate. It has been edited as necessary during review. Jonnie Kind, MD

## 2018-01-29 LAB — GLUCOSE TOLERANCE, 2 HOURS W/ 1HR
GLUCOSE, 1 HOUR: 90 mg/dL (ref 65–179)
Glucose, 2 hour: 72 mg/dL (ref 65–152)
Glucose, Fasting: 73 mg/dL (ref 65–91)

## 2018-01-29 LAB — ANTIBODY SCREEN: Antibody Screen: NEGATIVE

## 2018-01-29 LAB — RPR: RPR Ser Ql: NONREACTIVE

## 2018-01-29 LAB — CBC
HEMATOCRIT: 36.9 % (ref 34.0–46.6)
HEMOGLOBIN: 12.3 g/dL (ref 11.1–15.9)
MCH: 31.9 pg (ref 26.6–33.0)
MCHC: 33.3 g/dL (ref 31.5–35.7)
MCV: 96 fL (ref 79–97)
Platelets: 256 10*3/uL (ref 150–379)
RBC: 3.86 x10E6/uL (ref 3.77–5.28)
RDW: 13 % (ref 12.3–15.4)
WBC: 8.3 10*3/uL (ref 3.4–10.8)

## 2018-01-29 LAB — HIV ANTIBODY (ROUTINE TESTING W REFLEX): HIV Screen 4th Generation wRfx: NONREACTIVE

## 2018-02-25 ENCOUNTER — Ambulatory Visit (INDEPENDENT_AMBULATORY_CARE_PROVIDER_SITE_OTHER): Payer: Self-pay | Admitting: Advanced Practice Midwife

## 2018-02-25 VITALS — BP 102/66 | HR 76 | Wt 188.0 lb

## 2018-02-25 DIAGNOSIS — Z331 Pregnant state, incidental: Secondary | ICD-10-CM

## 2018-02-25 DIAGNOSIS — Z3482 Encounter for supervision of other normal pregnancy, second trimester: Secondary | ICD-10-CM

## 2018-02-25 DIAGNOSIS — Z3A31 31 weeks gestation of pregnancy: Secondary | ICD-10-CM

## 2018-02-25 DIAGNOSIS — Z1389 Encounter for screening for other disorder: Secondary | ICD-10-CM

## 2018-02-25 DIAGNOSIS — Z3483 Encounter for supervision of other normal pregnancy, third trimester: Secondary | ICD-10-CM

## 2018-02-25 LAB — POCT URINALYSIS DIPSTICK
Blood, UA: NEGATIVE
GLUCOSE UA: NEGATIVE
Ketones, UA: NEGATIVE
LEUKOCYTES UA: NEGATIVE
NITRITE UA: NEGATIVE
PROTEIN UA: NEGATIVE

## 2018-02-25 NOTE — Patient Instructions (Signed)
Kinesiology taping for pregnancy:  Youtube has good vidoes of "how tos" for lower back, pelvic, hip pain; swelling of feet, etc  

## 2018-02-25 NOTE — Progress Notes (Signed)
  G5P2022 [redacted]w[redacted]d Estimated Date of Delivery: 04/28/18  Blood pressure 102/66, pulse 76, weight 188 lb (85.3 kg), last menstrual period 05/16/2017, unknown if currently breastfeeding.   BP weight and urine results all reviewed and noted.  Please refer to the obstetrical flow sheet for the fundal height and fetal heart rate documentation:  Patient reports good fetal movement, denies any bleeding and no rupture of membranes symptoms or regular contractions. Patient had some spotting after being "overly active" and intercourse 2 weeks ago, none since. Having pelvic pressure more now.  All questions were answered.   Physical Assessment:   Vitals:   02/25/18 0944  BP: 102/66  Pulse: 76  Weight: 188 lb (85.3 kg)  Body mass index is 37.97 kg/m.        Physical Examination:   General appearance: Well appearing, and in no distress  Mental status: Alert, oriented to person, place, and time  Skin: Warm & dry  Cardiovascular: Normal heart rate noted  Respiratory: Normal respiratory effort, no distress  Abdomen: Soft, gravid, nontender  Pelvic: Cervical exam deferred         Extremities: Edema: None  Fetal Status:     Movement: Present    Results for orders placed or performed in visit on 02/25/18 (from the past 24 hour(s))  POCT Urinalysis Dipstick   Collection Time: 02/25/18  9:48 AM  Result Value Ref Range   Color, UA     Clarity, UA     Glucose, UA neg    Bilirubin, UA     Ketones, UA neg    Spec Grav, UA  1.010 - 1.025   Blood, UA neg    pH, UA  5.0 - 8.0   Protein, UA neg    Urobilinogen, UA  0.2 or 1.0 E.U./dL   Nitrite, UA neg    Leukocytes, UA Negative Negative   Appearance     Odor       Orders Placed This Encounter  Procedures  . POCT Urinalysis Dipstick    Plan:  Continued routine obstetrical care, kinesiology tape for pelvic pain.   Return in about 2 weeks (around 03/11/2018) for [place].

## 2018-03-11 ENCOUNTER — Encounter: Payer: Self-pay | Admitting: Obstetrics and Gynecology

## 2018-03-19 ENCOUNTER — Ambulatory Visit (INDEPENDENT_AMBULATORY_CARE_PROVIDER_SITE_OTHER): Payer: Self-pay | Admitting: Obstetrics and Gynecology

## 2018-03-19 ENCOUNTER — Encounter: Payer: Self-pay | Admitting: Obstetrics and Gynecology

## 2018-03-19 VITALS — BP 100/62 | HR 90 | Wt 190.8 lb

## 2018-03-19 DIAGNOSIS — Z3A34 34 weeks gestation of pregnancy: Secondary | ICD-10-CM

## 2018-03-19 DIAGNOSIS — Z3483 Encounter for supervision of other normal pregnancy, third trimester: Secondary | ICD-10-CM

## 2018-03-19 DIAGNOSIS — Z331 Pregnant state, incidental: Secondary | ICD-10-CM

## 2018-03-19 DIAGNOSIS — Z1389 Encounter for screening for other disorder: Secondary | ICD-10-CM

## 2018-03-19 LAB — POCT URINALYSIS DIPSTICK
Glucose, UA: NEGATIVE
Ketones, UA: NEGATIVE
Nitrite, UA: NEGATIVE
PROTEIN UA: NEGATIVE
RBC UA: NEGATIVE

## 2018-03-19 NOTE — Progress Notes (Signed)
Patient ID: Ashlee Vincent, female   DOB: 1996/02/13, 22 y.o.   MRN: [id number]   LOW-RISK PREGNANCY VISIT Patient name: Ashlee Vincent MRN [id number]  Date of birth: 1996/02/18 Chief Complaint:   Routine Prenatal Visit  History of Present Illness:   Ashlee Vincent is a 22 y.o. [id number] female at 105w2d with an Estimated Date of Delivery: 04/28/18 being seen today for ongoing management of a low-risk pregnancy.  Today she reports no complaints. She is considering the nexplanon for after giving birth. Contractions: Irregular. Vag. Bleeding: None.  Movement: Present. denies leaking of fluid. Review of Systems:   Pertinent items are noted in HPI Denies abnormal vaginal discharge w/ itching/odor/irritation, headaches, visual changes, shortness of breath, chest pain, abdominal pain, severe nausea/vomiting, or problems with urination or bowel movements unless otherwise stated above. Pertinent History Reviewed:  Reviewed past medical,surgical, social, obstetrical and family history.  Reviewed problem list, medications and allergies. Physical Assessment:   Vitals:   03/19/18 0940  BP: 100/62  Pulse: 90  Weight: 190 lb 12.8 oz (86.5 kg)  Body mass index is 38.54 kg/m.        Physical Examination:   General appearance: Well appearing, and in no distress  Mental status: Alert, oriented to person, place, and time  Skin: Warm & dry  Cardiovascular: Normal heart rate noted  Respiratory: Normal respiratory effort, no distress  Abdomen: Soft, gravid, nontender  Pelvic: Cervical exam deferred         Extremities: Edema: Trace  Fetal Status: Fetal Heart Rate (bpm): 167 Fundal Height: 35 cm Movement: Present    Results for orders placed or performed in visit on 03/19/18 (from the past 24 hour(s))  POCT urinalysis dipstick   Collection Time: 03/19/18  9:42 AM  Result Value Ref Range   Color, UA     Clarity, UA     Glucose, UA neg    Bilirubin, UA     Ketones, UA neg    Spec Grav, UA  1.010  - 1.025   Blood, UA neg    pH, UA  5.0 - 8.0   Protein, UA neg    Urobilinogen, UA  0.2 or 1.0 E.U./dL   Nitrite, UA neg    Leukocytes, UA Trace (A) Negative   Appearance     Odor      Assessment & Plan:  1) Low-risk pregnancy [id number] at [redacted]w[redacted]d with an Estimated Date of Delivery: 04/28/18    Meds: No orders of the defined types were placed in this encounter.  Labs/procedures today: fht  Plan:  Continue routine obstetrical care  BC = Nexplanon Breast Follow-up: Return in about 2 weeks (around 04/02/2018) for LROB.  Orders Placed This Encounter  Procedures   POCT urinalysis dipstick   By signing my name below, I, Margit Banda, attest that this documentation has been prepared under the direction and in the presence of Jonnie Kind, MD. Electronically Signed: Margit Banda, Medical Scribe. 03/19/18. 10:18 AM.  I personally performed the services described in this documentation, which was SCRIBED in my presence. The recorded information has been reviewed and considered accurate. It has been edited as necessary during review. Jonnie Kind, MD  '

## 2018-04-02 ENCOUNTER — Encounter: Payer: Self-pay | Admitting: Advanced Practice Midwife

## 2018-04-09 ENCOUNTER — Encounter: Payer: Self-pay | Admitting: Advanced Practice Midwife

## 2018-04-09 ENCOUNTER — Ambulatory Visit (INDEPENDENT_AMBULATORY_CARE_PROVIDER_SITE_OTHER): Payer: Self-pay | Admitting: Advanced Practice Midwife

## 2018-04-09 VITALS — BP 100/60 | HR 95 | Wt 194.0 lb

## 2018-04-09 DIAGNOSIS — Z331 Pregnant state, incidental: Secondary | ICD-10-CM

## 2018-04-09 DIAGNOSIS — Z1389 Encounter for screening for other disorder: Secondary | ICD-10-CM

## 2018-04-09 DIAGNOSIS — Z3A37 37 weeks gestation of pregnancy: Secondary | ICD-10-CM

## 2018-04-09 DIAGNOSIS — Z3483 Encounter for supervision of other normal pregnancy, third trimester: Secondary | ICD-10-CM

## 2018-04-09 LAB — POCT URINALYSIS DIPSTICK
Blood, UA: NEGATIVE
Glucose, UA: NEGATIVE
Ketones, UA: NEGATIVE
Leukocytes, UA: NEGATIVE
NITRITE UA: NEGATIVE
PROTEIN UA: POSITIVE — AB

## 2018-04-09 NOTE — Progress Notes (Addendum)
  [id number] [redacted]w[redacted]d Estimated Date of Delivery: 04/28/18  Blood pressure 100/60, pulse 95, weight 194 lb (88 kg), last menstrual period 05/16/2017, unknown if currently breastfeeding.   BP weight and urine results all reviewed and noted.  Please refer to the obstetrical flow sheet for the fundal height and fetal heart rate documentation:  Patient reports good fetal movement, denies any bleeding and no rupture of membranes symptoms or regular contractions. Patient is without complaints  Has had some dizziness  Probably low BP  Tips given All questions were answered.   Physical Assessment:   Vitals:   04/09/18 1437  BP: 100/60  Pulse: 95  Weight: 194 lb (88 kg)  Body mass index is 39.18 kg/m.        Physical Examination:   General appearance: Well appearing, and in no distress  Mental status: Alert, oriented to person, place, and time  Skin: Warm & dry  Cardiovascular: Normal heart rate noted  Respiratory: Normal respiratory effort, no distress  Abdomen: Soft, gravid, nontender  Pelvic: Cervical exam performed  Dilation: 1 Effacement (%): Thick Station: -3  Extremities: Edema: Trace  Fetal Status: Fetal Heart Rate (bpm): 148 Fundal Height: 38 cm Movement: Present Presentation: Vertex  Results for orders placed or performed in visit on 04/16/18 (from the past 24 hour(s))  POCT urinalysis dipstick   Collection Time: 04/16/18 11:09 AM  Result Value Ref Range   Color, UA     Clarity, UA     Glucose, UA Negative Negative   Bilirubin, UA     Ketones, UA neg    Spec Grav, UA  1.010 - 1.025   Blood, UA neg    pH, UA  5.0 - 8.0   Protein, UA Negative Negative   Urobilinogen, UA  0.2 or 1.0 E.U./dL   Nitrite, UA neg    Leukocytes, UA Negative Negative   Appearance     Odor       Orders Placed This Encounter  Procedures  . Strep Gp B NAA  . GC/Chlamydia Probe Amp  . POCT urinalysis dipstick    Plan:  Continued routine obstetrical care,   Return in about 1 week (around  04/16/2018) for LROB.

## 2018-04-11 LAB — STREP GP B NAA: Strep Gp B NAA: NEGATIVE

## 2018-04-12 LAB — GC/CHLAMYDIA PROBE AMP
Chlamydia trachomatis, NAA: NEGATIVE
Neisseria gonorrhoeae by PCR: NEGATIVE

## 2018-04-16 ENCOUNTER — Ambulatory Visit (INDEPENDENT_AMBULATORY_CARE_PROVIDER_SITE_OTHER): Payer: Self-pay | Admitting: Advanced Practice Midwife

## 2018-04-16 ENCOUNTER — Other Ambulatory Visit: Payer: Self-pay

## 2018-04-16 ENCOUNTER — Encounter: Payer: Self-pay | Admitting: Advanced Practice Midwife

## 2018-04-16 VITALS — BP 134/80 | HR 99 | Wt 196.0 lb

## 2018-04-16 DIAGNOSIS — Z3A38 38 weeks gestation of pregnancy: Secondary | ICD-10-CM

## 2018-04-16 DIAGNOSIS — Z1389 Encounter for screening for other disorder: Secondary | ICD-10-CM

## 2018-04-16 DIAGNOSIS — Z331 Pregnant state, incidental: Secondary | ICD-10-CM

## 2018-04-16 DIAGNOSIS — Z3483 Encounter for supervision of other normal pregnancy, third trimester: Secondary | ICD-10-CM

## 2018-04-16 LAB — POCT URINALYSIS DIPSTICK
Glucose, UA: NEGATIVE
Ketones, UA: NEGATIVE
LEUKOCYTES UA: NEGATIVE
NITRITE UA: NEGATIVE
PROTEIN UA: NEGATIVE
RBC UA: NEGATIVE

## 2018-04-16 MED ORDER — METRONIDAZOLE 500 MG PO TABS: 500.0000 mg | ORAL_TABLET | Freq: Two times a day (BID) | ORAL | 0 refills | Status: DC

## 2018-04-16 NOTE — Addendum Note (Signed)
Addended by: Christin Fudge on: 04/16/2018 11:53 AM   Modules accepted: Orders

## 2018-04-16 NOTE — Patient Instructions (Addendum)
AM I IN LABOR? What is labor? Labor is the work that your body does to birth your baby. Your uterus (the womb) contracts. Your cervix (the mouth of the uterus) opens. You will push your baby out into the world.  What do contractions (labor pains) feel like? When they first start, contractions usually feel like cramps during your period. Sometimes you feel pain in your back. Most often, contractions feel like muscles pulling painfully in your lower belly. At first, the contractions will probably be 15 to 20 minutes apart. They will not feel too painful. As labor goes on, the contractions get stronger, closer together, and more painful.  How do I time the contractions? Time your contractions by counting the number of minutes from the start of one contraction to the start of the next contraction.  What should I do when the contractions start? If it is night and you can sleep, sleep. If it happens during the day, here are some things you can do to take care of yourself at home: ? Walk. If the pains you are having are real labor, walking will make the contractions come faster and harder. If the contractions are not going to continue and be real labor, walking will make the contractions slow down. ? Take a shower or bath. This will help you relax. ? Eat. Labor is a big event. It takes a lot of energy. ? Drink water. Not drinking enough water can cause false labor (contractions that hurt but do not open your cervix). If this is true labor, drinking water will help you have strength to get through your labor. ? Take a nap. Get all the rest you can. ? Get a massage. If your labor is in your back, a strong massage on your lower back may feel very good. Getting a foot massage is always good. ? Don't panic. You can do this. Your body was made for this. You are strong!  When should I go to the hospital or call my health care provider? ? Your contractions have been 5 minutes apart or less for at least 1  hour. ? If several contractions are so painful you cannot walk or talk during one. ? Your bag of waters breaks. (You may have a big gush of water or just water that runs down your legs when you walk.)  Are there other reasons to call my health care provider? Yes, you should call your health care provider or go to the hospital if you start to bleed like you are having a period- blood that soaks your underwear or runs down your legs, if you have sudden severe pain, if your baby has not moved for several hours, or if you are leaking green fluid. The rule is as follows: If you are very concerned about something, call.   Safe Medications in Pregnancy   Acne: Benzoyl Peroxide Salicylic Acid  Backache/Headache: Tylenol: 2 regular strength every 4 hours OR              2 Extra strength every 6 hours  Colds/Coughs/Allergies: Benadryl (alcohol free) 25 mg every 6 hours as needed Breath right strips Claritin Cepacol throat lozenges Chloraseptic throat spray Cold-Eeze- up to three times per day Cough drops, alcohol free Flonase (by prescription only) Guaifenesin Mucinex Robitussin DM (plain only, alcohol free) Saline nasal spray/drops Sudafed (pseudoephedrine) & Actifed ** use only after [redacted] weeks gestation and if you do not have high blood pressure Tylenol Vicks Vaporub Zinc lozenges Zyrtec  Constipation: Colace Ducolax suppositories Fleet enema Glycerin suppositories Metamucil Milk of magnesia Miralax Senokot Smooth move tea  Diarrhea: Kaopectate Imodium A-D  *NO pepto Bismol  Hemorrhoids: Anusol Anusol HC Preparation H Tucks  Indigestion: Tums Maalox Mylanta Zantac  Pepcid  Insomnia: Benadryl (alcohol free) 25mg  every 6 hours as needed Tylenol PM Unisom, no Gelcaps  Leg Cramps: Tums MagGel  Nausea/Vomiting:  Bonine Dramamine Emetrol Ginger extract Sea bands Meclizine  Nausea medication to take during pregnancy:  Unisom (doxylamine succinate  25 mg tablets) Take one tablet daily at bedtime. If symptoms are not adequately controlled, the dose can be increased to a maximum recommended dose of two tablets daily (1/2 tablet in the morning, 1/2 tablet mid-afternoon and one at bedtime). Vitamin B6 100mg  tablets. Take one tablet twice a day (up to 200 mg per day).  Skin Rashes: Aveeno products Benadryl cream or 25mg  every 6 hours as needed Calamine Lotion 1% cortisone cream  Yeast infection: Gyne-lotrimin 7 Monistat 7   **If taking multiple medications, please check labels to avoid duplicating the same active ingredients **take medication as directed on the label ** Do not exceed 4000 mg of tylenol in 24 hours **Do not take medications that contain aspirin or ibuprofen

## 2018-04-16 NOTE — Progress Notes (Signed)
  [id number] [redacted]w[redacted]d Estimated Date of Delivery: 04/28/18  Blood pressure 134/80, pulse 99, weight 196 lb (88.9 kg), last menstrual period 05/16/2017, unknown if currently breastfeeding.   BP weight and urine results all reviewed and noted.  Please refer to the obstetrical flow sheet for the fundal height and fetal heart rate documentation:  Patient reports good fetal movement, denies any bleeding and no rupture of membranes symptoms or regular contractions. Patient has had some green discharge, noitch/irritaiton.  All questions were answered.   Physical Assessment:   Vitals:   04/16/18 1107  BP: 134/80  Pulse: 99  Weight: 196 lb (88.9 kg)  Body mass index is 39.59 kg/m.        Physical Examination:   General appearance: Well appearing, and in no distress  Mental status: Alert, oriented to person, place, and time  Skin: Warm & dry  Cardiovascular: Normal heart rate noted  Respiratory: Normal respiratory effort, no distress  Abdomen: Soft, gravid, nontender  Pelvic: Cervical exam performed  Dilation: 1.5 Effacement (%): Thick Station: -2 SE: normal appearing DC  Extremities: Edema: Trace  Fetal Status: Fetal Heart Rate (bpm): 152 Fundal Height: 39 cm Movement: Present Presentation: Vertex  Results for orders placed or performed in visit on 04/16/18 (from the past 24 hour(s))  POCT urinalysis dipstick   Collection Time: 04/16/18 11:09 AM  Result Value Ref Range   Color, UA     Clarity, UA     Glucose, UA Negative Negative   Bilirubin, UA     Ketones, UA neg    Spec Grav, UA  1.010 - 1.025   Blood, UA neg    pH, UA  5.0 - 8.0   Protein, UA Negative Negative   Urobilinogen, UA  0.2 or 1.0 E.U./dL   Nitrite, UA neg    Leukocytes, UA Negative Negative   Appearance     Odor       Orders Placed This Encounter  Procedures  . POCT urinalysis dipstick    Plan:  Continued routine obstetrical care,   Return in about 1 week (around 04/23/2018) for LROB.

## 2018-04-19 ENCOUNTER — Other Ambulatory Visit: Payer: Self-pay

## 2018-04-19 ENCOUNTER — Inpatient Hospital Stay (HOSPITAL_COMMUNITY)
Admission: AD | Admit: 2018-04-19 | Discharge: 2018-04-20 | DRG: 807 | Disposition: A | Discharge status: Home / Self Care | Payer: Medicaid Other | Attending: Family Medicine | Admitting: Family Medicine

## 2018-04-19 ENCOUNTER — Encounter (HOSPITAL_COMMUNITY): Payer: Self-pay

## 2018-04-19 DIAGNOSIS — Z3483 Encounter for supervision of other normal pregnancy, third trimester: Secondary | ICD-10-CM | POA: Diagnosis present

## 2018-04-19 DIAGNOSIS — Z3A38 38 weeks gestation of pregnancy: Secondary | ICD-10-CM

## 2018-04-19 LAB — CBC
HCT: 37 % (ref 36.0–46.0)
Hemoglobin: 11.9 g/dL — ABNORMAL LOW (ref 12.0–15.0)
MCH: 28 pg (ref 26.0–34.0)
MCHC: 32.2 g/dL (ref 30.0–36.0)
MCV: 87.1 fL (ref 78.0–100.0)
PLATELETS: 252 10*3/uL (ref 150–400)
RBC: 4.25 MIL/uL (ref 3.87–5.11)
RDW: 14.2 % (ref 11.5–15.5)
WBC: 9.6 10*3/uL (ref 4.0–10.5)

## 2018-04-19 LAB — RPR: RPR Ser Ql: NONREACTIVE

## 2018-04-19 LAB — TYPE AND SCREEN
ABO/RH(D): O POS
Antibody Screen: NEGATIVE

## 2018-04-19 MED ORDER — IBUPROFEN 600 MG PO TABS
600.0000 mg | ORAL_TABLET | Freq: Four times a day (QID) | ORAL | Status: DC
  Administered 2018-04-19 – 2018-04-20 (×4): 600 mg via ORAL
  Filled 2018-04-19 (×5): qty 1

## 2018-04-19 MED ORDER — LACTATED RINGERS IV SOLN: 500.0000 mL | INTRAVENOUS | Status: DC | PRN

## 2018-04-19 MED ORDER — FLEET ENEMA 7-19 GM/118ML RE ENEM: 1.0000 | ENEMA | RECTAL | Status: DC | PRN

## 2018-04-19 MED ORDER — SENNOSIDES-DOCUSATE SODIUM 8.6-50 MG PO TABS
2.0000 | ORAL_TABLET | ORAL | Status: DC
  Administered 2018-04-20: 2 via ORAL
  Filled 2018-04-19: qty 2

## 2018-04-19 MED ORDER — ACETAMINOPHEN 325 MG PO TABS: 650.0000 mg | ORAL_TABLET | ORAL | Status: DC | PRN

## 2018-04-19 MED ORDER — ONDANSETRON HCL 4 MG/2ML IJ SOLN: 4.0000 mg | INTRAMUSCULAR | Status: DC | PRN

## 2018-04-19 MED ORDER — LACTATED RINGERS IV SOLN
INTRAVENOUS | Status: DC
  Administered 2018-04-19 (×2): via INTRAVENOUS

## 2018-04-19 MED ORDER — SODIUM CHLORIDE 0.9 % IV SOLN: 250.0000 mL | INTRAVENOUS | Status: DC | PRN

## 2018-04-19 MED ORDER — DIPHENHYDRAMINE HCL 25 MG PO CAPS: 25.0000 mg | ORAL_CAPSULE | Freq: Four times a day (QID) | ORAL | Status: DC | PRN

## 2018-04-19 MED ORDER — ZOLPIDEM TARTRATE 5 MG PO TABS
5.0000 mg | ORAL_TABLET | Freq: Every evening | ORAL | Status: DC | PRN
Start: 2018-04-19 — End: 2018-04-20

## 2018-04-19 MED ORDER — BENZOCAINE-MENTHOL 20-0.5 % EX AERO
1.0000 "application " | INHALATION_SPRAY | CUTANEOUS | Status: DC | PRN
  Administered 2018-04-19: 1 via TOPICAL
  Filled 2018-04-19 (×2): qty 56

## 2018-04-19 MED ORDER — DIBUCAINE 1 % RE OINT
1.0000 "application " | TOPICAL_OINTMENT | RECTAL | Status: DC | PRN
  Filled 2018-04-19: qty 28

## 2018-04-19 MED ORDER — FENTANYL CITRATE (PF) 100 MCG/2ML IJ SOLN
100.0000 ug | INTRAMUSCULAR | Status: DC | PRN
  Administered 2018-04-19 (×2): 100 ug via INTRAVENOUS
  Filled 2018-04-19 (×2): qty 2

## 2018-04-19 MED ORDER — LIDOCAINE HCL (PF) 1 % IJ SOLN
30.0000 mL | INTRAMUSCULAR | Status: DC | PRN
  Filled 2018-04-19: qty 30

## 2018-04-19 MED ORDER — ONDANSETRON HCL 4 MG PO TABS: 4.0000 mg | ORAL_TABLET | ORAL | Status: DC | PRN

## 2018-04-19 MED ORDER — OXYCODONE-ACETAMINOPHEN 5-325 MG PO TABS: 2.0000 | ORAL_TABLET | ORAL | Status: DC | PRN

## 2018-04-19 MED ORDER — MEASLES, MUMPS & RUBELLA VAC ~~LOC~~ INJ
0.5000 mL | INJECTION | Freq: Once | SUBCUTANEOUS | Status: DC
  Filled 2018-04-19: qty 0.5

## 2018-04-19 MED ORDER — SIMETHICONE 80 MG PO CHEW: 80.0000 mg | CHEWABLE_TABLET | ORAL | Status: DC | PRN

## 2018-04-19 MED ORDER — ONDANSETRON HCL 4 MG/2ML IJ SOLN: 4.0000 mg | Freq: Four times a day (QID) | INTRAMUSCULAR | Status: DC | PRN

## 2018-04-19 MED ORDER — TETANUS-DIPHTH-ACELL PERTUSSIS 5-2.5-18.5 LF-MCG/0.5 IM SUSP
0.5000 mL | Freq: Once | INTRAMUSCULAR | Status: AC
  Administered 2018-04-20: 0.5 mL via INTRAMUSCULAR
  Filled 2018-04-19 (×2): qty 0.5

## 2018-04-19 MED ORDER — WITCH HAZEL-GLYCERIN EX PADS: 1.0000 "application " | MEDICATED_PAD | CUTANEOUS | Status: DC | PRN

## 2018-04-19 MED ORDER — OXYTOCIN BOLUS FROM INFUSION
500.0000 mL | Freq: Once | INTRAVENOUS | Status: AC
  Administered 2018-04-19: 500 mL via INTRAVENOUS

## 2018-04-19 MED ORDER — OXYCODONE-ACETAMINOPHEN 5-325 MG PO TABS: 1.0000 | ORAL_TABLET | ORAL | Status: DC | PRN

## 2018-04-19 MED ORDER — SOD CITRATE-CITRIC ACID 500-334 MG/5ML PO SOLN: 30.0000 mL | ORAL | Status: DC | PRN

## 2018-04-19 MED ORDER — SODIUM CHLORIDE 0.9% FLUSH: 3.0000 mL | INTRAVENOUS | Status: DC | PRN

## 2018-04-19 MED ORDER — OXYTOCIN 40 UNITS IN LACTATED RINGERS INFUSION - SIMPLE MED
2.5000 [IU]/h | INTRAVENOUS | Status: DC
  Administered 2018-04-19: 2.5 [IU]/h via INTRAVENOUS
  Filled 2018-04-19: qty 1000

## 2018-04-19 MED ORDER — SODIUM CHLORIDE 0.9% FLUSH: 3.0000 mL | Freq: Two times a day (BID) | INTRAVENOUS | Status: DC

## 2018-04-19 MED ORDER — OXYTOCIN 40 UNITS IN LACTATED RINGERS INFUSION - SIMPLE MED
1.0000 m[IU]/min | INTRAVENOUS | Status: DC
  Administered 2018-04-19: 2 m[IU]/min via INTRAVENOUS
  Administered 2018-04-19: 6 m[IU]/min via INTRAVENOUS

## 2018-04-19 MED ORDER — ACETAMINOPHEN 325 MG PO TABS
650.0000 mg | ORAL_TABLET | ORAL | Status: DC | PRN
  Administered 2018-04-19 (×2): 650 mg via ORAL
  Filled 2018-04-19 (×2): qty 2

## 2018-04-19 MED ORDER — PRENATAL MULTIVITAMIN CH
1.0000 | ORAL_TABLET | Freq: Every day | ORAL | Status: DC
  Administered 2018-04-20: 1 via ORAL
  Filled 2018-04-19: qty 1

## 2018-04-19 MED ORDER — TERBUTALINE SULFATE 1 MG/ML IJ SOLN: 0.2500 mg | Freq: Once | INTRAMUSCULAR | Status: DC | PRN

## 2018-04-19 MED ORDER — COCONUT OIL OIL
1.0000 "application " | TOPICAL_OIL | Status: DC | PRN
  Filled 2018-04-19: qty 120

## 2018-04-19 NOTE — Progress Notes (Signed)
S: states pain med is helping with pain. O: VSS, FHR 120's with accels, no decels, mod variability. uc's mild and 4-5 min apart. SVE 5-6/80/-1 AROM mod mec noted. A: IUP @ 38.5 wks. Stable maternal fetal unit, inadequate uc's P : pit aug of labor

## 2018-04-19 NOTE — H&P (Signed)
Ashlee Vincent is a 22 y.o. female [phone] @ 38.6 wks presenting for contractions since 2100 last night.GBS neg.. OB History    Gravida  5   Para  2   Term  2   Preterm  0   AB  2   Living  2     SAB  2   TAB  0   Ectopic  0   Multiple  0   Live Births  2          Past Medical History:  Diagnosis Date  . Medical history non-contributory    Past Surgical History:  Procedure Laterality Date  . NO PAST SURGERIES     Family History: family history includes Diabetes in her maternal grandmother and paternal grandmother. Social History:  reports that she has never smoked. She has never used smokeless tobacco. She reports that she does not drink alcohol or use drugs.     Maternal Diabetes: No Genetic Screening: Normal Maternal Ultrasounds/Referrals: Normal Fetal Ultrasounds or other Referrals:  None Maternal Substance Abuse:  No Significant Maternal Medications:  None Significant Maternal Lab Results:  None Other Comments:  None  ROS Maternal Medical History:  Reason for admission: Contractions.   Contractions: Onset was 13-24 hours ago.   Frequency: regular.    Fetal activity: Perceived fetal activity is normal.   Last perceived fetal movement was within the past hour.    Prenatal complications: no prenatal complications Prenatal Complications - Diabetes: none.    Dilation: 5.5 Effacement (%): 80 Station: -1 Exam by:: Daiva Nakayama, CNM  Blood pressure 128/69, pulse 82, temperature 97.8 F (36.6 C), temperature source Oral, resp. rate 16, height 4\' 11"  (1.499 m), weight 195 lb 1.4 oz (88.5 kg), last menstrual period 05/16/2017, SpO2 99 %, unknown if currently breastfeeding. Maternal Exam:  Uterine Assessment: Contraction strength is mild.  Contraction frequency is regular.   Abdomen: Patient reports no abdominal tenderness. Fetal presentation: vertex  Introitus: Normal vulva. Normal vagina.  Ferning test: not done.  Nitrazine test: not  done. Amniotic fluid character: meconium stained.  Pelvis: adequate for delivery.   Cervix: Cervix evaluated by digital exam.     Fetal Exam Fetal Monitor Review: Mode: ultrasound.   Variability: moderate (6-25 bpm).   Pattern: accelerations present and no decelerations.    Fetal State Assessment: Category I - tracings are normal.     Physical Exam  Constitutional: She is oriented to person, place, and time. She appears well-developed and well-nourished.  HENT:  Head: Normocephalic.  Eyes: Pupils are equal, round, and reactive to light.  Neck: Normal range of motion.  Cardiovascular: Normal rate, regular rhythm, normal heart sounds and intact distal pulses.  Respiratory: Effort normal and breath sounds normal.  GI: Soft. Bowel sounds are normal.  Genitourinary: Vagina normal and uterus normal.  Musculoskeletal: Normal range of motion.  Neurological: She is alert and oriented to person, place, and time. She has normal reflexes.  Skin: Skin is warm and dry.  Psychiatric: She has a normal mood and affect. Her behavior is normal. Judgment and thought content normal.    Prenatal labs: ABO, Rh: O/Positive/-- (01/21 1617) Antibody: Negative (03/13 0909) Rubella: 2.17 (01/21 1617) RPR: Non Reactive (03/13 0909)  HBsAg: Negative (01/21 1617)  HIV: Non Reactive (03/13 0909)  GBS: Negative (05/23 1600)   Assessment/Plan: Labor GBS neg admit   Koren Shiver 04/19/2018, 9:03 AM

## 2018-04-19 NOTE — MAU Note (Signed)
Pt having cntrx 5 mins apart since 0100.  Denies bleeding or LOF. +FM

## 2018-04-19 NOTE — Lactation Note (Signed)
This note was copied from a baby's chart. Lactation Consultation Note  Patient Name: Girl Mary-Ann Pennella [id number] Date: 04/19/2018 Reason for consult: Initial assessment;Early term 4-38.6wks  7 hours old early term female who is being partially BF and formula fed by her mother, she's a P3 and that was her feeding choice upon admission. Mom is experienced BF she was able to BF her first child for 36 months and her second one for 12 months. She also has a hand pump at home and participated in the [facility] (2-Rh) program at [facility]; she knows how to hand express.  Baby was swaddled and asleep on mom's bed when entering the room, offered assistance with latch but mom politely declined stating that baby has already fed formula, supplementation with Dory Horn formula started this afternoon. Per mom feeding at the breast are comfortable and both of her nipples are intact with no signs of trauma. Mom also hears swallows when baby is at the breast.  Encouraged mom to feed baby STS at the breast 8-12 times/24 hours or sooner if feeding cues are present. Mom will try to priorizing BF over formula feeding, discussed lactogenesis II and the possibility of recalibrating amount of formula given once her milk comes in. [place] BF brochure (SP), BF resources and feeding diary (SP), mom is aware of [facility] services and will call PRN.  Maternal Data Formula Feeding for Exclusion: Yes Reason for exclusion: Mother's choice to formula and breast feed on admission Has patient been taught Hand Expression?: Yes Does the patient have breastfeeding experience prior to this delivery?: Yes  Feeding Feeding Type: Formula  Interventions Interventions: Breast feeding basics reviewed  Lactation Tools Discussed/Used WIC Program: Yes   Consult Status Consult Status: Follow-up Date: 04/20/18 Follow-up type: In-patient    Reegan Bouffard Francene Boyers 04/19/2018, 7:13 PM

## 2018-04-20 ENCOUNTER — Telehealth: Payer: Self-pay | Admitting: *Deleted

## 2018-04-20 MED ORDER — OXYCODONE HCL 5 MG PO TABS
5.0000 mg | ORAL_TABLET | Freq: Once | ORAL | Status: AC
  Administered 2018-04-20: 5 mg via ORAL
  Filled 2018-04-20: qty 1

## 2018-04-20 MED ORDER — PRENATAL MULTIVITAMIN CH: 1.0000 | ORAL_TABLET | Freq: Every day | ORAL | Status: DC

## 2018-04-20 MED ORDER — IBUPROFEN 600 MG PO TABS: 600.0000 mg | ORAL_TABLET | Freq: Four times a day (QID) | ORAL | 0 refills | Status: DC

## 2018-04-20 MED ORDER — SENNOSIDES-DOCUSATE SODIUM 8.6-50 MG PO TABS: 2.0000 | ORAL_TABLET | ORAL | Status: DC

## 2018-04-20 MED ORDER — MEASLES, MUMPS & RUBELLA VAC ~~LOC~~ INJ: 0.5000 mL | INJECTION | Freq: Once | SUBCUTANEOUS | 0 refills | Status: AC

## 2018-04-20 MED ORDER — TETANUS-DIPHTH-ACELL PERTUSSIS 5-2.5-18.5 LF-MCG/0.5 IM SUSP: 0.5000 mL | Freq: Once | INTRAMUSCULAR | 0 refills | Status: AC

## 2018-04-20 MED ORDER — ACETAMINOPHEN 325 MG PO TABS: 650.0000 mg | ORAL_TABLET | ORAL | Status: DC | PRN

## 2018-04-20 NOTE — Discharge Summary (Addendum)
OB Discharge Summary     Patient Name: Ashlee Vincent DOB: 11/13/1996 MRN: [id number]  Date of admission: 04/19/2018 Delivering MD: Koren Shiver D   Date of discharge: 04/20/2018  Admitting diagnosis: 38wks ctx 5 mins Intrauterine pregnancy: [redacted]w[redacted]d    Secondary diagnosis:  Active Problems:   Normal labor  Discharge diagnosis: Term Pregnancy Delivered                                                                                                Post partum procedures:none  Augmentation: none  Complications: None  Hospital course:  Onset of Labor With Vaginal Delivery     22y.o. yo [id number] admitted in Active Labor on 04/19/2018. Patient had an uncomplicated labor course as follows:  Membrane Rupture Time/Date: 8:55 AM ,04/19/2018   Intrapartum Procedures: Episiotomy: None [1]                                         Lacerations:  None [1]  Patient had a delivery of a Viable infant. 04/19/2018  Information for the patient's newborn:  CaLatandra, [id number] Method: Vaginal, Spontaneous(Filed from Delivery Summary)    Pateint had an uncomplicated postpartum course.  She is ambulating, tolerating a regular diet, passing flatus, and urinating well. Patient is discharged home in stable condition on 04/20/18.   Physical exam  Vitals:   04/19/18 1309 04/19/18 1419 04/19/18 1802 04/20/18 0630  BP: (!) 114/56 (!) 111/53 99/67 (!) 91/51  Pulse: 87 75 87 63  Resp: 16 20  18   Temp: 98.8 F (37.1 C) 98.6 F (37 C) 97.9 F (36.6 C) 98 F (36.7 C)  TempSrc: Oral Oral  Oral  SpO2:  97% 99% 96%  Weight:      Height:       General: alert, cooperative and no distress Lochia: appropriate Uterine Fundus: firm DVT Evaluation: No evidence of DVT seen on physical exam. Labs: Lab Results  Component Value Date   WBC 9.6 04/19/2018   HGB 11.9 (L) 04/19/2018   HCT 37.0 04/19/2018   MCV 87.1 04/19/2018   PLT 252 04/19/2018   CMP Latest Ref Rng & Units  05/17/2017  Glucose 65 - 99 mg/dL 101(H)  BUN 6 - 20 mg/dL 10  Creatinine 0.44 - 1.00 mg/dL 0.53  Sodium 135 - 145 mmol/L 143  Potassium 3.5 - 5.1 mmol/L 4.0  Chloride 101 - 111 mmol/L 111  CO2 22 - 32 mmol/L 24  Calcium 8.9 - 10.3 mg/dL 9.0  Total Protein 6.5 - 8.1 g/dL 7.5  Total Bilirubin 0.3 - 1.2 mg/dL 1.0  Alkaline Phos 38 - 126 U/L 86  AST 15 - 41 U/L 16  ALT 14 - 54 U/L 17    Discharge instruction: per After Visit Summary and "Baby and Me Booklet".  After visit meds:  Allergies as of 04/20/2018   No Known Allergies     Medication List    TAKE these medications  acetaminophen 325 MG tablet Commonly known as:  TYLENOL Take 2 tablets (650 mg total) by mouth every 4 (four) hours as needed (for pain scale < 4).   ibuprofen 600 MG tablet Commonly known as:  ADVIL,MOTRIN Take 1 tablet (600 mg total) by mouth every 6 (six) hours.   measles, mumps and rubella vaccine injection Commonly known as:  MMR Inject 0.5 mLs into the skin once for 1 dose.   miconazole 2 % vaginal cream Commonly known as:  MONISTAT 7 Place 1 Applicatorful vaginally at bedtime as needed (itching).   prenatal multivitamin Tabs tablet Take 1 tablet by mouth daily at 12 noon.   senna-docusate 8.6-50 MG tablet Commonly known as:  Senokot-S Take 2 tablets by mouth daily. Start taking on:  04/21/2018   Tdap 5-2.5-18.5 LF-MCG/0.5 injection Commonly known as:  BOOSTRIX Inject 0.5 mLs into the muscle once for 1 dose.       Diet: routine diet  Activity: Advance as tolerated. Pelvic rest for 6 weeks.   Outpatient follow up:4 weeks Follow up Appt: Future Appointments  Date Time Provider [place]  04/24/2018 12:45 PM Roma Schanz, CNM [facility]   Follow up Visit:No follow-ups on file.  Postpartum contraception: Nexplanon  Newborn Data: Live born female  Birth Weight: 6 lb 8.6 oz (2965 g) APGAR: 9, 9  Newborn Delivery   Birth date/time:  04/19/2018 11:22:00 Delivery  type:  Vaginal, Spontaneous     Baby Feeding: Breast Disposition:home with mother   04/20/2018 Bonnita Hollow, MD  OB FELLOW DISCHARGE ATTESTATION  I have seen and examined this patient. I agree with above documentation and have made edits as needed.   Luiz Blare, DO OB Fellow

## 2018-04-20 NOTE — Telephone Encounter (Signed)
Called pt to set up postpartum appointment but had to leave message with a man for a return call.  04-20-18  AS

## 2018-04-20 NOTE — Discharge Instructions (Signed)
Vaginal Delivery, Care After °Refer to this sheet in the next few weeks. These instructions provide you with information about caring for yourself after vaginal delivery. Your health care provider may also give you more specific instructions. Your treatment has been planned according to current medical practices, but problems sometimes occur. Call your health care provider if you have any problems or questions. °What can I expect after the procedure? °After vaginal delivery, it is common to have: °· Some bleeding from your vagina. °· Soreness in your abdomen, your vagina, and the area of skin between your vaginal opening and your anus (perineum). °· Pelvic cramps. °· Fatigue. ° °Follow these instructions at home: °Medicines °· Take over-the-counter and prescription medicines only as told by your health care provider. °· If you were prescribed an antibiotic medicine, take it as told by your health care provider. Do not stop taking the antibiotic until it is finished. °Driving ° °· Do not drive or operate heavy machinery while taking prescription pain medicine. °· Do not drive for 24 hours if you received a sedative. °Lifestyle °· Do not drink alcohol. This is especially important if you are breastfeeding or taking medicine to relieve pain. °· Do not use tobacco products, including cigarettes, chewing tobacco, or e-cigarettes. If you need help quitting, ask your health care provider. °Eating and drinking °· Drink at least 8 eight-ounce glasses of water every day unless you are told not to by your health care provider. If you choose to breastfeed your baby, you may need to drink more water than this. °· Eat high-fiber foods every day. These foods may help prevent or relieve constipation. High-fiber foods include: °? Whole grain cereals and breads. °? Brown rice. °? Beans. °? Fresh fruits and vegetables. °Activity °· Return to your normal activities as told by your health care provider. Ask your health care provider  what activities are safe for you. °· Rest as much as possible. Try to rest or take a nap when your baby is sleeping. °· Do not lift anything that is heavier than your baby or 10 lb (4.5 kg) until your health care provider says that it is safe. °· Talk with your health care provider about when you can engage in sexual activity. This may depend on your: °? Risk of infection. °? Rate of healing. °? Comfort and desire to engage in sexual activity. °Vaginal Care °· If you have an episiotomy or a vaginal tear, check the area every day for signs of infection. Check for: °? More redness, swelling, or pain. °? More fluid or blood. °? Warmth. °? Pus or a bad smell. °· Do not use tampons or douches until your health care provider says this is safe. °· Watch for any blood clots that may pass from your vagina. These may look like clumps of dark red, brown, or black discharge. °General instructions °· Keep your perineum clean and dry as told by your health care provider. °· Wear loose, comfortable clothing. °· Wipe from front to back when you use the toilet. °· Ask your health care provider if you can shower or take a bath. If you had an episiotomy or a perineal tear during labor and delivery, your health care provider may tell you not to take baths for a certain length of time. °· Wear a bra that supports your breasts and fits you well. °· If possible, have someone help you with household activities and help care for your baby for at least a few days after   you leave the hospital. °· Keep all follow-up visits for you and your baby as told by your health care provider. This is important. °Contact a health care provider if: °· You have: °? Vaginal discharge that has a bad smell. °? Difficulty urinating. °? Pain when urinating. °? A sudden increase or decrease in the frequency of your bowel movements. °? More redness, swelling, or pain around your episiotomy or vaginal tear. °? More fluid or blood coming from your episiotomy or  vaginal tear. °? Pus or a bad smell coming from your episiotomy or vaginal tear. °? A fever. °? A rash. °? Little or no interest in activities you used to enjoy. °? Questions about caring for yourself or your baby. °· Your episiotomy or vaginal tear feels warm to the touch. °· Your episiotomy or vaginal tear is separating or does not appear to be healing. °· Your breasts are painful, hard, or turn red. °· You feel unusually sad or worried. °· You feel nauseous or you vomit. °· You pass large blood clots from your vagina. If you pass a blood clot from your vagina, save it to show to your health care provider. Do not flush blood clots down the toilet without having your health care provider look at them. °· You urinate more than usual. °· You are dizzy or light-headed. °· You have not breastfed at all and you have not had a menstrual period for 12 weeks after delivery. °· You have stopped breastfeeding and you have not had a menstrual period for 12 weeks after you stopped breastfeeding. °Get help right away if: °· You have: °? Pain that does not go away or does not get better with medicine. °? Chest pain. °? Difficulty breathing. °? Blurred vision or spots in your vision. °? Thoughts about hurting yourself or your baby. °· You develop pain in your abdomen or in one of your legs. °· You develop a severe headache. °· You faint. °· You bleed from your vagina so much that you fill two sanitary pads in one hour. °This information is not intended to replace advice given to you by your health care provider. Make sure you discuss any questions you have with your health care provider. °Document Released: 11/01/2000 Document Revised: 04/17/2016 Document Reviewed: 11/19/2015 °Elsevier Interactive Patient Education © 2018 [employer] ° °

## 2018-04-24 ENCOUNTER — Encounter: Payer: Self-pay | Admitting: Women's Health

## 2018-05-14 ENCOUNTER — Encounter (HOSPITAL_COMMUNITY): Payer: Self-pay

## 2018-05-14 ENCOUNTER — Emergency Department (HOSPITAL_COMMUNITY)
Admission: EM | Admit: 2018-05-14 | Discharge: 2018-05-14 | Disposition: A | Discharge status: Home / Self Care | Payer: Self-pay | Attending: Emergency Medicine | Admitting: Emergency Medicine

## 2018-05-14 ENCOUNTER — Other Ambulatory Visit: Payer: Self-pay

## 2018-05-14 DIAGNOSIS — Z79899 Other long term (current) drug therapy: Secondary | ICD-10-CM | POA: Insufficient documentation

## 2018-05-14 DIAGNOSIS — L723 Sebaceous cyst: Secondary | ICD-10-CM | POA: Insufficient documentation

## 2018-05-14 MED ORDER — CEPHALEXIN 500 MG PO CAPS: 500.0000 mg | ORAL_CAPSULE | Freq: Three times a day (TID) | ORAL | 0 refills | Status: DC

## 2018-05-14 MED ORDER — LIDOCAINE-EPINEPHRINE (PF) 1 %-1:200000 IJ SOLN
20.0000 mL | Freq: Once | INTRAMUSCULAR | Status: AC
  Administered 2018-05-14: 20 mL
  Filled 2018-05-14: qty 30

## 2018-05-14 NOTE — ED Provider Notes (Signed)
[facility] EMERGENCY DEPARTMENT Provider Note   CSN: [id number] Arrival date & time: 05/14/18  0459     History   Chief Complaint Chief Complaint  Patient presents with  . Abscess    HPI Ashlee Vincent is a 22 y.o. female.  Patient states she is had a "bump" on her right buttock for the past 3 years that has become more painful and enlarged in the past 2 days.  She states she had this looked at the health department what was told not to worry about if it was not causing her any problems.  Denies any fever, chills, nausea or vomiting.  No chest pain or shortness of breath.  No bleeding or drainage. Patient is postpartum by about 3 weeks.  The history is provided by the patient.  Abscess  Associated symptoms: no nausea and no vomiting     Past Medical History:  Diagnosis Date  . Medical history non-contributory     Patient Active Problem List   Diagnosis Date Noted  . Normal labor 04/19/2018  . Supervision of normal pregnancy 12/08/2017  . Asymptomatic bacteriuria during pregnancy in second trimester 12/08/2017    Past Surgical History:  Procedure Laterality Date  . NO PAST SURGERIES       OB History    Gravida  5   Para  3   Term  3   Preterm  0   AB  2   Living  3     SAB  2   TAB  0   Ectopic  0   Multiple  0   Live Births  3            Home Medications    Prior to Admission medications   Medication Sig Start Date End Date Taking? Authorizing Provider  acetaminophen (TYLENOL) 325 MG tablet Take 2 tablets (650 mg total) by mouth every 4 (four) hours as needed (for pain scale < 4). 04/20/18   Bonnita Hollow, MD  ibuprofen (ADVIL,MOTRIN) 600 MG tablet Take 1 tablet (600 mg total) by mouth every 6 (six) hours. 04/20/18   Bonnita Hollow, MD  miconazole (MONISTAT 7) 2 % vaginal cream Place 1 Applicatorful vaginally at bedtime as needed (itching).    [provider]  Prenatal Vit-Fe Fumarate-FA (PRENATAL MULTIVITAMIN) TABS tablet  Take 1 tablet by mouth daily at 12 noon. 04/20/18   Bonnita Hollow, MD  senna-docusate (SENOKOT-S) 8.6-50 MG tablet Take 2 tablets by mouth daily. 04/21/18   Bonnita Hollow, MD    Family History Family History  Problem Relation Age of Onset  . Diabetes Maternal Grandmother   . Diabetes Paternal Grandmother     Social History Social History   Tobacco Use  . Smoking status: Never Smoker  . Smokeless tobacco: Never Used  Substance Use Topics  . Alcohol use: No  . Drug use: No     Allergies   Patient has no known allergies.   Review of Systems Review of Systems  Constitutional: Negative for activity change and appetite change.  HENT: Negative for congestion.   Respiratory: Negative for cough, chest tightness and shortness of breath.   Cardiovascular: Negative for chest pain.  Gastrointestinal: Negative for abdominal pain, nausea and vomiting.  Genitourinary: Negative for dysuria, hematuria, vaginal bleeding and vaginal discharge.  Musculoskeletal: Negative for arthralgias and myalgias.  Skin: Positive for rash and wound.  Neurological: Negative for dizziness, weakness, light-headedness and numbness.    all other systems are negative  except as noted in the HPI and PMH.   Physical Exam Updated Vital Signs BP 124/78 (BP Location: Left Arm)   Pulse 70   Temp 98.1 F (36.7 C) (Oral)   Resp 17   Ht 4\' 11"  (1.499 m)   Wt 81.6 kg (180 lb)   LMP 05/16/2017 (LMP Unknown)   SpO2 99%   BMI 36.36 kg/m   Physical Exam  Constitutional: She is oriented to person, place, and time. She appears well-developed and well-nourished. No distress.  HENT:  Head: Normocephalic and atraumatic.  Mouth/Throat: Oropharynx is clear and moist. No oropharyngeal exudate.  Eyes: Pupils are equal, round, and reactive to light. Conjunctivae and EOM are normal.  Neck: Normal range of motion. Neck supple.  No meningismus.  Cardiovascular: Normal rate, regular rhythm, normal heart sounds and  intact distal pulses.  No murmur heard. Pulmonary/Chest: Effort normal and breath sounds normal. No respiratory distress.  Abdominal: Soft. There is no tenderness. There is no rebound and no guarding.  Genitourinary:  Genitourinary Comments: Chaperone present.  On the right buttock she has approximately 1.5 cm cystic structure with mild surrounding erythema.  There is central fluctuance.  Musculoskeletal: Normal range of motion. She exhibits no edema or tenderness.  Neurological: She is alert and oriented to person, place, and time. No cranial nerve deficit. She exhibits normal muscle tone. Coordination normal.   5/5 strength throughout. CN 2-12 intact.Equal grip strength.   Skin: Skin is warm.  Psychiatric: She has a normal mood and affect. Her behavior is normal.  Nursing note and vitals reviewed.    ED Treatments / Results  Labs (all labs ordered are listed, but only abnormal results are displayed) Labs Reviewed - No data to display  EKG None  Radiology No results found.  Procedures .Marland KitchenIncision and Drainage Date/Time: 05/14/2018 6:20 AM Performed by: Ezequiel Essex, MD Authorized by: Ezequiel Essex, MD   Consent:    Consent obtained:  Verbal   Consent given by:  Patient   Risks discussed:  Bleeding, incomplete drainage, pain and infection   Alternatives discussed:  Delayed treatment Location:    Type:  Cyst   Size:  2   Location:  Lower extremity   Lower extremity location:  Buttock   Buttock location:  R buttock Pre-procedure details:    Skin preparation:  Chloraprep Anesthesia (see MAR for exact dosages):    Anesthesia method:  Local infiltration   Local anesthetic:  Lidocaine 1% WITH epi Procedure type:    Complexity:  Complex Procedure details:    Needle aspiration: no     Incision types:  Elliptical   Incision depth:  Subcutaneous   Scalpel blade:  11   Wound management:  Probed and deloculated and irrigated with saline   Drainage characteristics:  cheesy and purulent.   Drainage amount:  Copious   Wound treatment:  Wound left open   Packing materials:  1/4 in iodoform gauze Post-procedure details:    Patient tolerance of procedure:  Tolerated well, no immediate complications   (including critical care time)  Medications Ordered in ED Medications  lidocaine-EPINEPHrine (XYLOCAINE-EPINEPHrine) 1 %-1:200000 (PF) injection 20 mL (has no administration in time range)     Initial Impression / Assessment and Plan / ED Course  I have reviewed the triage vital signs and the nursing notes.  Pertinent labs & imaging results that were available during my care of the patient were reviewed by me and considered in my medical decision making (see chart for details).  Patient with bump to buttock for past several years that has begun, acutely enlarged and painful.  Bedside ultrasound does show fluid collection.  Patient agrees to incision and drainage.  Incision and drainage as above.  Appears consistent with sebaceous cyst possibly superinfected.  Cyst wall was removed but discussed with patient that small particles may remain  Patient given wound care instructions, packing, prophylactic antibiotics.  She is breast-feeding. Follow up for wound check in 2 days. Packing may be removed at that time if still present. Return precautions discussed.  Final Clinical Impressions(s) / ED Diagnoses   Final diagnoses:  Sebaceous cyst    ED Discharge Orders    None       Caiden Arteaga, Annie Main, MD 05/14/18 [phone]

## 2018-05-14 NOTE — ED Triage Notes (Signed)
Pt reports a large bump to her right buttock that has gotten bigger and more painful. No drainage from site at this time.

## 2018-05-14 NOTE — Discharge Instructions (Addendum)
Take the antibiotics as prescribed and perform the warm soaks as described.  Follow-up with your doctor for a wound check in 2 days.  If the packing is still present in 2 days you may remove it at that time.  Return to the ED if you have worsening pain, fever, vomiting or other concerns.

## 2018-05-18 ENCOUNTER — Emergency Department (HOSPITAL_COMMUNITY)
Admission: EM | Admit: 2018-05-18 | Discharge: 2018-05-19 | Disposition: A | Discharge status: Home / Self Care | Payer: Self-pay | Attending: Emergency Medicine | Admitting: Emergency Medicine

## 2018-05-18 ENCOUNTER — Ambulatory Visit: Payer: Self-pay | Admitting: Women's Health

## 2018-05-18 ENCOUNTER — Encounter (HOSPITAL_COMMUNITY): Payer: Self-pay | Admitting: Emergency Medicine

## 2018-05-18 ENCOUNTER — Encounter: Payer: Self-pay | Admitting: *Deleted

## 2018-05-18 DIAGNOSIS — Z79899 Other long term (current) drug therapy: Secondary | ICD-10-CM | POA: Insufficient documentation

## 2018-05-18 DIAGNOSIS — L0231 Cutaneous abscess of buttock: Secondary | ICD-10-CM | POA: Insufficient documentation

## 2018-05-18 MED ORDER — HYDROCODONE-ACETAMINOPHEN 5-325 MG PO TABS: 1.0000 | ORAL_TABLET | ORAL | 0 refills | Status: DC | PRN

## 2018-05-18 MED ORDER — DOXYCYCLINE HYCLATE 100 MG PO CAPS: 100.0000 mg | ORAL_CAPSULE | Freq: Two times a day (BID) | ORAL | 0 refills | Status: DC

## 2018-05-18 MED ORDER — ONDANSETRON HCL 4 MG PO TABS
4.0000 mg | ORAL_TABLET | Freq: Once | ORAL | Status: AC
  Administered 2018-05-18: 4 mg via ORAL
  Filled 2018-05-18: qty 1

## 2018-05-18 MED ORDER — LIDOCAINE-EPINEPHRINE (PF) 2 %-1:200000 IJ SOLN
20.0000 mL | Freq: Once | INTRAMUSCULAR | Status: AC
  Administered 2018-05-18: 20 mL
  Filled 2018-05-18: qty 20

## 2018-05-18 MED ORDER — DOXYCYCLINE HYCLATE 100 MG PO TABS
100.0000 mg | ORAL_TABLET | Freq: Once | ORAL | Status: AC
  Administered 2018-05-18: 100 mg via ORAL
  Filled 2018-05-18: qty 1

## 2018-05-18 MED ORDER — HYDROCODONE-ACETAMINOPHEN 5-325 MG PO TABS
2.0000 | ORAL_TABLET | Freq: Once | ORAL | Status: AC
  Administered 2018-05-18: 2 via ORAL
  Filled 2018-05-18: qty 2

## 2018-05-18 MED ORDER — POVIDONE-IODINE 10 % EX SOLN
CUTANEOUS | Status: AC
  Administered 2018-05-18: 1
  Filled 2018-05-18: qty 45

## 2018-05-18 NOTE — ED Provider Notes (Addendum)
[facility] EMERGENCY DEPARTMENT Provider Note   CSN: [id number] Arrival date & time: 05/18/18  2637     History   Chief Complaint Chief Complaint  Patient presents with  . Abscess    HPI Ashlee Vincent is a 22 y.o. female.  Patient is a 22 year old female who presents to the emergency department with a complaint of worsening of buttocks pain.  The patient states that she developed an abscess or cyst on her right buttocks.  She had an incision and drainage in the emergency department June 27.  A copious amount of material was removed.  The patient's area was packed with packing, and the patient was placed on Keflex.  The patient states however she did not get this prescription filled, because she thought it was for pain.  The patient states that the area is at the surgical site is getting hard and painful.  She has not had chills or sweats or nausea or vomiting, but states that it is difficult to walk and difficult to sit.  She would like to have the area rechecked.     Past Medical History:  Diagnosis Date  . Medical history non-contributory     Patient Active Problem List   Diagnosis Date Noted  . Normal labor 04/19/2018  . Supervision of normal pregnancy 12/08/2017  . Asymptomatic bacteriuria during pregnancy in second trimester 12/08/2017    Past Surgical History:  Procedure Laterality Date  . NO PAST SURGERIES       OB History    Gravida  5   Para  3   Term  3   Preterm  0   AB  2   Living  3     SAB  2   TAB  0   Ectopic  0   Multiple  0   Live Births  3            Home Medications    Prior to Admission medications   Medication Sig Start Date End Date Taking? Authorizing Provider  acetaminophen (TYLENOL) 325 MG tablet Take 2 tablets (650 mg total) by mouth every 4 (four) hours as needed (for pain scale < 4). 04/20/18   Bonnita Hollow, MD  cephALEXin (KEFLEX) 500 MG capsule Take 1 capsule (500 mg total) by mouth 3 (three) times  daily. 05/14/18   Rancour, Annie Main, MD  ibuprofen (ADVIL,MOTRIN) 600 MG tablet Take 1 tablet (600 mg total) by mouth every 6 (six) hours. 04/20/18   Bonnita Hollow, MD  miconazole (MONISTAT 7) 2 % vaginal cream Place 1 Applicatorful vaginally at bedtime as needed (itching).    [provider]  Prenatal Vit-Fe Fumarate-FA (PRENATAL MULTIVITAMIN) TABS tablet Take 1 tablet by mouth daily at 12 noon. 04/20/18   Bonnita Hollow, MD  senna-docusate (SENOKOT-S) 8.6-50 MG tablet Take 2 tablets by mouth daily. 04/21/18   Bonnita Hollow, MD    Family History Family History  Problem Relation Age of Onset  . Diabetes Maternal Grandmother   . Diabetes Paternal Grandmother     Social History Social History   Tobacco Use  . Smoking status: Never Smoker  . Smokeless tobacco: Never Used  Substance Use Topics  . Alcohol use: No  . Drug use: No     Allergies   Patient has no known allergies.   Review of Systems Review of Systems  Constitutional: Negative for activity change.       All ROS Neg except as noted in HPI  HENT: Negative for nosebleeds.   Eyes: Negative for photophobia and discharge.  Respiratory: Negative for cough, shortness of breath and wheezing.   Cardiovascular: Negative for chest pain and palpitations.  Gastrointestinal: Negative for abdominal pain and blood in stool.  Genitourinary: Negative for dysuria, frequency and hematuria.  Musculoskeletal: Negative for arthralgias, back pain and neck pain.  Skin: Positive for wound.       Abscess right buttock  Neurological: Negative for dizziness, seizures and speech difficulty.  Psychiatric/Behavioral: Negative for confusion and hallucinations.     Physical Exam Updated Vital Signs BP (!) 118/95 (BP Location: Right Arm)   Pulse 74   Temp 98.5 F (36.9 C) (Oral)   Resp 18   Ht 4\' 11"  (1.499 m)   Wt 81.6 kg (180 lb)   LMP 05/16/2017 (LMP Unknown)   SpO2 100%   Breastfeeding? Yes   BMI 36.36 kg/m    Physical Exam  Constitutional: She is oriented to person, place, and time. She appears well-developed and well-nourished.  Non-toxic appearance.  HENT:  Head: Normocephalic.  Right Ear: Tympanic membrane and external ear normal.  Left Ear: Tympanic membrane and external ear normal.  Eyes: Pupils are equal, round, and reactive to light. EOM and lids are normal.  Neck: Normal range of motion. Neck supple. Carotid bruit is not present.  Cardiovascular: Normal rate, regular rhythm, normal heart sounds, intact distal pulses and normal pulses.  Pulmonary/Chest: Breath sounds normal. No respiratory distress.  Abdominal: Soft. Bowel sounds are normal. There is no tenderness. There is no guarding.  Genitourinary:  Genitourinary Comments: Chaperone present during examination.  The incision area of the assist of the buttocks is healing nicely.  However between the incision area in the cleft of the buttocks is a hard red warm painful area.  No red streaks going up the back.  The anal area is not involved.  Musculoskeletal: Normal range of motion.  Lymphadenopathy:       Head (right side): No submandibular adenopathy present.       Head (left side): No submandibular adenopathy present.    She has no cervical adenopathy.  Neurological: She is alert and oriented to person, place, and time. She has normal strength. No cranial nerve deficit or sensory deficit.  Skin: Skin is warm and dry.  Psychiatric: She has a normal mood and affect. Her speech is normal.  Nursing note and vitals reviewed.    ED Treatments / Results  Labs (all labs ordered are listed, but only abnormal results are displayed) Labs Reviewed  AEROBIC CULTURE (SUPERFICIAL SPECIMEN)    EKG None  Radiology No results found.   Procedures .Marland KitchenIncision and Drainage Date/Time: 05/18/2018 11:38 AM Performed by: Lily Kocher, PA-C Authorized by: Lily Kocher, PA-C   Consent:    Consent obtained:  Verbal   Consent given by:   Patient   Risks discussed:  Bleeding, incomplete drainage, infection and pain Location:    Type:  Abscess   Location: right buttock. Pre-procedure details:    Skin preparation:  Betadine Anesthesia (see MAR for exact dosages):    Anesthesia method:  Local infiltration   Local anesthetic:  Lidocaine 2% WITH epi Procedure type:    Complexity:  Simple Procedure details:    Incision types:  Single straight   Incision depth:  Subcutaneous   Scalpel blade:  11   Wound management:  Probed and deloculated, irrigated with saline and extensive cleaning   Drainage:  Bloody and purulent   Drainage amount:  Moderate  Wound treatment:  Wound left open Post-procedure details:    Patient tolerance of procedure:  Tolerated well, no immediate complications   (including critical care time)  Medications Ordered in ED Medications  lidocaine-EPINEPHrine (XYLOCAINE W/EPI) 2 %-1:200000 (PF) injection 20 mL (has no administration in time range)  povidone-iodine (BETADINE) 10 % external solution (has no administration in time range)     Initial Impression / Assessment and Plan / ED Course  I have reviewed the triage vital signs and the nursing notes.  Pertinent labs & imaging results that were available during my care of the patient were reviewed by me and considered in my medical decision making (see chart for details).  Clinical Course as of May 18 1137  Mon May 18, 7153  [id number] 22 year old female who is returning with worsening of an abscess that was I&D a couple of days ago.  She is noticed increased pain and swelling.  We did a bedside ultrasound and she is definitely got still a pocket of abscess although the area of cellulitis and induration that is more medial does not appear to have formed to do an abscess.  We are going to re-I&D it and change the antibiotics up to give her some MRSA coverage.   [MB]    Clinical Course User Index [MB] Hayden Rasmussen, MD      Final Clinical  Impressions(s) / ED Diagnoses  MDM   Vital signs reviewed.  The previous emergency department records were reviewed.  Patient seen with me by Dr. Melina Copa.  Bedside ultrasound was performed by Dr. Melina Copa.  Incision and drainage was performed by me with moderate amount of bloody purulent material removed.  The area was irrigated.  The wound was left open bandage was applied.  We will stop the Keflex, patient will be placed on doxycycline.  I have emphasized to the patient the importance of taking this antibiotic so hopefully she is not confused that it is to be used for pain.  Also discussed with her that with her pain medication and with her antibiotic she should refrain from breast-feeding.  The patient acknowledges understanding of these instructions.  Patient is to return to the emergency department for additional evaluation of any changes, problems, or concerns.   Final diagnoses:  Abscess of buttock, right    ED Discharge Orders        Ordered    doxycycline (VIBRAMYCIN) 100 MG capsule  2 times daily     05/18/18 1013    HYDROcodone-acetaminophen (NORCO/VICODIN) 5-325 MG tablet  Every 4 hours PRN     05/18/18 1013       Lily Kocher, PA-C 05/19/18 1921    Hayden Rasmussen, MD 05/20/18 0748    Lily Kocher, PA-C 06/23/18 1733    Hayden Rasmussen, MD 06/23/18 1947    Lily Kocher, PA-C 06/24/18 1202    Hayden Rasmussen, MD 06/25/18 [phone]

## 2018-05-18 NOTE — ED Triage Notes (Signed)
Pt reports she had I & d a few days ago on her right buttock.  Did not get her Keflex filled because she thought it was just for pain and she does not have insurance.  States the redness and induration is spreading.

## 2018-05-18 NOTE — Discharge Instructions (Addendum)
Please use doxycycline 2 times daily with food.  You may stop the Keflex that you were previously prescribed.  Please use warm Epson salt tub soaks for about 15 minutes daily until the wound has healed from the inside out.  Change the dressing daily.  Use Tylenol extra strength for mild pain, use Norco for more severe pain.  Please do not continue to breast-feed while you are on the doxycycline or the Norco.  Please see your primary physician or return to the emergency department if any changes in your condition, problems, or concerns.

## 2018-05-21 LAB — AEROBIC CULTURE W GRAM STAIN (SUPERFICIAL SPECIMEN): Culture: NORMAL

## 2018-05-21 LAB — AEROBIC CULTURE  (SUPERFICIAL SPECIMEN)

## 2018-05-22 ENCOUNTER — Telehealth: Payer: Self-pay

## 2018-05-22 NOTE — Telephone Encounter (Signed)
Post ED Visit - Positive Culture Follow-up  Culture report reviewed by antimicrobial stewardship pharmacist:  [x]  Elenor Quinones, Pharm.D. []  Heide Guile, Pharm.D., BCPS AQ-ID []  Parks Neptune, Pharm.D., BCPS []  Alycia Rossetti, Pharm.D., BCPS []  [place], Pharm.D., BCPS, AAHIVP []  Legrand Como, Pharm.D., BCPS, AAHIVP []  Salome Arnt, PharmD, BCPS []  Johnnette Gourd, PharmD, BCPS []  Hughes Better, PharmD, BCPS []  Leeroy Cha, PharmD  Positive aerobic culture Treated with Doxycycline, organism sensitive to the same and no further patient follow-up is required at this time.  Genia Del 05/22/2018, 9:59 AM

## 2019-06-03 ENCOUNTER — Other Ambulatory Visit: Payer: Self-pay

## 2019-06-03 DIAGNOSIS — Z20822 Contact with and (suspected) exposure to covid-19: Secondary | ICD-10-CM

## 2019-06-03 NOTE — Progress Notes (Signed)
lab

## 2019-06-09 LAB — NOVEL CORONAVIRUS, NAA: SARS-CoV-2, NAA: NOT DETECTED

## 2019-09-24 ENCOUNTER — Other Ambulatory Visit: Payer: Self-pay

## 2019-09-24 DIAGNOSIS — Z20822 Contact with and (suspected) exposure to covid-19: Secondary | ICD-10-CM

## 2019-09-25 LAB — NOVEL CORONAVIRUS, NAA: SARS-CoV-2, NAA: DETECTED — AB

## 2020-05-16 ENCOUNTER — Other Ambulatory Visit: Payer: Self-pay

## 2020-05-16 ENCOUNTER — Inpatient Hospital Stay (HOSPITAL_COMMUNITY): Payer: Self-pay

## 2020-05-16 ENCOUNTER — Inpatient Hospital Stay (HOSPITAL_COMMUNITY)
Admission: AD | Admit: 2020-05-16 | Discharge: 2020-05-16 | Disposition: A | Discharge status: Home / Self Care | Payer: Self-pay | Attending: Obstetrics and Gynecology | Admitting: Obstetrics and Gynecology

## 2020-05-16 ENCOUNTER — Encounter (HOSPITAL_COMMUNITY): Payer: Self-pay | Admitting: Obstetrics and Gynecology

## 2020-05-16 DIAGNOSIS — R109 Unspecified abdominal pain: Secondary | ICD-10-CM

## 2020-05-16 DIAGNOSIS — R102 Pelvic and perineal pain: Secondary | ICD-10-CM | POA: Insufficient documentation

## 2020-05-16 DIAGNOSIS — Z3A01 Less than 8 weeks gestation of pregnancy: Secondary | ICD-10-CM

## 2020-05-16 DIAGNOSIS — Z3491 Encounter for supervision of normal pregnancy, unspecified, first trimester: Secondary | ICD-10-CM

## 2020-05-16 DIAGNOSIS — O26891 Other specified pregnancy related conditions, first trimester: Secondary | ICD-10-CM

## 2020-05-16 DIAGNOSIS — Z3A08 8 weeks gestation of pregnancy: Secondary | ICD-10-CM

## 2020-05-16 DIAGNOSIS — Z833 Family history of diabetes mellitus: Secondary | ICD-10-CM | POA: Insufficient documentation

## 2020-05-16 DIAGNOSIS — O4691 Antepartum hemorrhage, unspecified, first trimester: Secondary | ICD-10-CM

## 2020-05-16 DIAGNOSIS — O209 Hemorrhage in early pregnancy, unspecified: Secondary | ICD-10-CM

## 2020-05-16 LAB — CBC
HCT: 39.3 % (ref 36.0–46.0)
Hemoglobin: 13.3 g/dL (ref 12.0–15.0)
MCH: 31.5 pg (ref 26.0–34.0)
MCHC: 33.8 g/dL (ref 30.0–36.0)
MCV: 93.1 fL (ref 80.0–100.0)
Platelets: 254 10*3/uL (ref 150–400)
RBC: 4.22 MIL/uL (ref 3.87–5.11)
RDW: 12.9 % (ref 11.5–15.5)
WBC: 8.7 10*3/uL (ref 4.0–10.5)
nRBC: 0 % (ref 0.0–0.2)

## 2020-05-16 LAB — POCT PREGNANCY, URINE: Preg Test, Ur: POSITIVE — AB

## 2020-05-16 LAB — URINALYSIS, ROUTINE W REFLEX MICROSCOPIC
Bilirubin Urine: NEGATIVE
Glucose, UA: NEGATIVE mg/dL
Ketones, ur: NEGATIVE mg/dL
Leukocytes,Ua: NEGATIVE
Nitrite: NEGATIVE
Protein, ur: NEGATIVE mg/dL
Specific Gravity, Urine: 1.013 (ref 1.005–1.030)
pH: 5 (ref 5.0–8.0)

## 2020-05-16 LAB — WET PREP, GENITAL
Clue Cells Wet Prep HPF POC: NONE SEEN
Sperm: NONE SEEN
Trich, Wet Prep: NONE SEEN
Yeast Wet Prep HPF POC: NONE SEEN

## 2020-05-16 LAB — HCG, QUANTITATIVE, PREGNANCY: hCG, Beta Chain, Quant, S: 33321 m[IU]/mL — ABNORMAL HIGH (ref <5)

## 2020-05-16 NOTE — MAU Provider Note (Signed)
Chief Complaint: Vaginal Bleeding and Pelvic Pain   First Provider Initiated Contact with Patient 05/16/20 1955     SUBJECTIVE HPI: Ashlee Vincent is a 24 y.o. [id number] at [redacted]w[redacted]d who presents to Maternity Admissions reporting vaginal bleeding & pelvic pain. Symptoms started yesterday. Reports brown spotting yesterday. Today it increased but is still brown. Also feels low back & pelvic pain. Denies n/v/d, dysuria, vaginal discharge.   Location: pelvic Quality: sharp Severity: 3/10 on pain scale Duration: 1 day Timing: intermittent Modifying factors: none Associated signs and symptoms: vaginal bleeding  Past Medical History:  Diagnosis Date  . Medical history non-contributory    OB History  Gravida Para Term Preterm AB Living  6 3 3  0 2 3  SAB TAB Ectopic Multiple Live Births  2 0 0 0 3    # Outcome Date GA Lbr Len/2nd Weight Sex Delivery Anes PTL Lv  6 Current           5 Term 04/19/18 [redacted]w[redacted]d 14:05 / 00:17 2965 g F Vag-Spont None  LIV     Birth Comments: none  4 Term 08/14/15 [redacted]w[redacted]d 18:30 / 00:16 3425 g F Vag-Spont Local N LIV     Birth Comments: WNL  3 SAB 06/27/14 [redacted]w[redacted]d            Birth Comments: System Generated. Please review and update pregnancy details.  2 SAB 02/16/14          1 Term 02/21/10 [redacted]w[redacted]d  3572 g F Vag-Spont None N LIV   Past Surgical History:  Procedure Laterality Date  . NO PAST SURGERIES     Social History   Socioeconomic History  . Marital status: Single    Spouse name: Not on file  . Number of children: Not on file  . Years of education: Not on file  . Highest education level: Not on file  Occupational History  . Not on file  Tobacco Use  . Smoking status: Never Smoker  . Smokeless tobacco: Never Used  Substance and Sexual Activity  . Alcohol use: No  . Drug use: No  . Sexual activity: Yes    Birth control/protection: None  Other Topics Concern  . Not on file  Social History Narrative  . Not on file   Social Determinants of Health    Financial Resource Strain:   . Difficulty of Paying Living Expenses:   Food Insecurity:   . Worried About Charity fundraiser in the Last Year:   . Arboriculturist in the Last Year:   Transportation Needs:   . Film/video editor (Medical):   Marland Kitchen Lack of Transportation (Non-Medical):   Physical Activity:   . Days of Exercise per Week:   . Minutes of Exercise per Session:   Stress:   . Feeling of Stress :   Social Connections:   . Frequency of Communication with Friends and Family:   . Frequency of Social Gatherings with Friends and Family:   . Attends Religious Services:   . Active Member of Clubs or Organizations:   . Attends Archivist Meetings:   Marland Kitchen Marital Status:   Intimate Partner Violence:   . Fear of Current or Ex-Partner:   . Emotionally Abused:   Marland Kitchen Physically Abused:   . Sexually Abused:    Family History  Problem Relation Age of Onset  . Diabetes Maternal Grandmother   . Diabetes Paternal Grandmother    No current facility-administered medications on file prior to encounter.  Current Outpatient Medications on File Prior to Encounter  Medication Sig Dispense Refill  . Prenatal Vit-Fe Fumarate-FA (PRENATAL MULTIVITAMIN) TABS tablet Take 1 tablet by mouth daily at 12 noon.    Marland Kitchen acetaminophen (TYLENOL) 325 MG tablet Take 2 tablets (650 mg total) by mouth every 4 (four) hours as needed (for pain scale < 4).    . cephALEXin (KEFLEX) 500 MG capsule Take 1 capsule (500 mg total) by mouth 3 (three) times daily. 30 capsule 0  . doxycycline (VIBRAMYCIN) 100 MG capsule Take 1 capsule (100 mg total) by mouth 2 (two) times daily. 14 capsule 0  . HYDROcodone-acetaminophen (NORCO/VICODIN) 5-325 MG tablet Take 1 tablet by mouth every 4 (four) hours as needed. 12 tablet 0  . ibuprofen (ADVIL,MOTRIN) 600 MG tablet Take 1 tablet (600 mg total) by mouth every 6 (six) hours. 30 tablet 0  . miconazole (MONISTAT 7) 2 % vaginal cream Place 1 Applicatorful vaginally at  bedtime as needed (itching).    Marland Kitchen senna-docusate (SENOKOT-S) 8.6-50 MG tablet Take 2 tablets by mouth daily.     No Known Allergies  I have reviewed patient's Past Medical Hx, Surgical Hx, Family Hx, Social Hx, medications and allergies.   Review of Systems  Constitutional: Negative.   Gastrointestinal: Negative.   Genitourinary: Positive for pelvic pain and vaginal bleeding. Negative for dysuria and vaginal discharge.  Musculoskeletal: Positive for back pain.    OBJECTIVE Patient Vitals for the past 24 hrs:  BP Temp Temp src Pulse Resp Weight  05/16/20 1936 110/64 98.1 F (36.7 C) Oral 77 18 87.5 kg   Constitutional: Well-developed, well-nourished female in no acute distress.  Cardiovascular: normal rate & rhythm, no murmur Respiratory: normal rate and effort. Lung sounds clear throughout GI: Abd soft, non-tender, Pos BS x 4. No guarding or rebound tenderness MS: Extremities nontender, no edema, normal ROM Neurologic: Alert and oriented x 4.  GU:     SPECULUM EXAM: NEFG, physiologic discharge, no blood noted, cervix clean  BIMANUAL: No CMT. cervix closed; uterus normal size, no adnexal tenderness or masses.    LAB RESULTS Results for orders placed or performed during the hospital encounter of 05/16/20 (from the past 24 hour(s))  Urinalysis, Routine w reflex microscopic     Status: Abnormal   Collection Time: 05/16/20  7:26 PM  Result Value Ref Range   Color, Urine YELLOW YELLOW   APPearance CLEAR CLEAR   Specific Gravity, Urine 1.013 1.005 - 1.030   pH 5.0 5.0 - 8.0   Glucose, UA NEGATIVE NEGATIVE mg/dL   Hgb urine dipstick SMALL (A) NEGATIVE   Bilirubin Urine NEGATIVE NEGATIVE   Ketones, ur NEGATIVE NEGATIVE mg/dL   Protein, ur NEGATIVE NEGATIVE mg/dL   Nitrite NEGATIVE NEGATIVE   Leukocytes,Ua NEGATIVE NEGATIVE   RBC / HPF 0-5 0 - 5 RBC/hpf   WBC, UA 0-5 0 - 5 WBC/hpf   Bacteria, UA RARE (A) NONE SEEN   Squamous Epithelial / LPF 0-5 0 - 5   Mucus PRESENT    Pregnancy, urine POC     Status: Abnormal   Collection Time: 05/16/20  7:44 PM  Result Value Ref Range   Preg Test, Ur POSITIVE (A) NEGATIVE  CBC     Status: None   Collection Time: 05/16/20  8:08 PM  Result Value Ref Range   WBC 8.7 4.0 - 10.5 K/uL   RBC 4.22 3.87 - 5.11 MIL/uL   Hemoglobin 13.3 12.0 - 15.0 g/dL   HCT 39.3 36 - 46 %  MCV 93.1 80.0 - 100.0 fL   MCH 31.5 26.0 - 34.0 pg   MCHC 33.8 30.0 - 36.0 g/dL   RDW 12.9 11.5 - 15.5 %   Platelets 254 150 - 400 K/uL   nRBC 0.0 0.0 - 0.2 %  hCG, quantitative, pregnancy     Status: Abnormal   Collection Time: 05/16/20  8:08 PM  Result Value Ref Range   hCG, Beta Chain, Quant, S 33,321 (H) <5 mIU/mL  Wet prep, genital     Status: Abnormal   Collection Time: 05/16/20  8:25 PM  Result Value Ref Range   Yeast Wet Prep HPF POC NONE SEEN NONE SEEN   Trich, Wet Prep NONE SEEN NONE SEEN   Clue Cells Wet Prep HPF POC NONE SEEN NONE SEEN   WBC, Wet Prep HPF POC MANY (A) NONE SEEN   Sperm NONE SEEN     IMAGING US OB Comp Less 14 Wks  Result Date: 05/16/2020 CLINICAL DATA:  Initial evaluation for acute vaginal bleeding, pelvic pain, early pregnancy. EXAM: OBSTETRIC <14 WK ULTRASOUND TECHNIQUE: Transabdominal ultrasound was performed for evaluation of the gestation as well as the maternal uterus and adnexal regions. COMPARISON:  None available. FINDINGS: Intrauterine gestational sac: Single Yolk sac:  Present Embryo:  Present Cardiac Activity: Present Heart Rate: 135 bpm CRL: 11.0 mm   7 w 1 d                  [place] EDC: 01/01/2021 Subchorionic hemorrhage:  None visualized. Maternal uterus/adnexae: Ovaries within normal limits bilaterally. 2.6 x 2.1 x 2.6 cm degenerating corpus luteal cyst noted within the right ovary. No free fluid. IMPRESSION: 1. Single viable intrauterine pregnancy as above, estimated gestational age [redacted] weeks and 1 day by crown-rump length, with ultrasound EDC of 01/01/2021. No complication. 2. 2.6 cm degenerating right  ovarian corpus luteal cyst. 3. No other acute maternal uterine or adnexal abnormality identified. Electronically Signed   By: Jeannine Boga M.D.   On: 05/16/2020 20:46    MAU COURSE Orders Placed This Encounter  Procedures  . Wet prep, genital  . US OB Comp Less 14 Wks  . Urinalysis, Routine w reflex microscopic  . CBC  . hCG, quantitative, pregnancy  . Pregnancy, urine POC  . Discharge patient   No orders of the defined types were placed in this encounter.   MDM +UPT UA, wet prep, GC/chlamydia, CBC, ABO/Rh, quant hCG, and [place] today to rule out ectopic pregnancy which can be life threatening.   RH positive  No blood on exam  Ultrasound shows live IUP measuring [redacted]w[redacted]d, EDD updated  ASSESSMENT 1. Normal IUP (intrauterine pregnancy) on prenatal ultrasound, first trimester   2. Vaginal bleeding in pregnancy, first trimester   3. Abdominal pain during pregnancy in first trimester   4. [redacted] weeks gestation of pregnancy     PLAN Discharge home in stable condition. GC/CT pending Discussed reasons to return to MAU Start prenatal care    Follow-up Information    [facility]. Schedule an appointment as soon as possible for a visit.   Specialty: Obstetrics and Gynecology Contact information: [address] [place] [phone]             Allergies as of 05/16/2020   No Known Allergies     Medication List    STOP taking these medications   acetaminophen 325 MG tablet Commonly known as: Tylenol   cephALEXin 500 MG capsule Commonly  known as: KEFLEX   doxycycline 100 MG capsule Commonly known as: VIBRAMYCIN   HYDROcodone-acetaminophen 5-325 MG tablet Commonly known as: NORCO/VICODIN   ibuprofen 600 MG tablet Commonly known as: ADVIL   miconazole 2 % vaginal cream Commonly known as: MONISTAT 7   senna-docusate 8.6-50 MG tablet Commonly known as: Senokot-S     TAKE these medications   prenatal multivitamin  Tabs tablet Take 1 tablet by mouth daily at 12 noon.        Jorje Guild, NP 05/16/2020  9:02 PM

## 2020-05-16 NOTE — MAU Note (Signed)
Pt reports to MAU stating yesterday she started noticing some brown spotting and it worsened today. Pt reports she has been dealing with ongoing back pain with this pregnancy as well. Pt reports the pain is currently a 3/10. Pt reports pain in her pelvis as well that feels stabbing that pain is a 3/10 also. LMP April 28th 2021.

## 2020-05-16 NOTE — Discharge Instructions (Signed)
Vaginal Bleeding During Pregnancy, First Trimester  A small amount of bleeding from the vagina (spotting) is relatively common during early pregnancy. It usually stops on its own. Various things may cause bleeding or spotting during early pregnancy. Some bleeding may be related to the pregnancy, and some may not. In many cases, the bleeding is normal and is not a problem. However, bleeding can also be a sign of something serious. Be sure to tell your health care provider about any vaginal bleeding right away. Some possible causes of vaginal bleeding during the first trimester include:  Infection or inflammation of the cervix.  Growths (polyps) on the cervix.  Miscarriage or threatened miscarriage.  Pregnancy tissue developing outside of the uterus (ectopic pregnancy).  A mass of tissue developing in the uterus due to an egg being fertilized incorrectly (molar pregnancy). Follow these instructions at home: Activity  Follow instructions from your health care provider about limiting your activity. Ask what activities are safe for you.  If needed, make plans for someone to help with your regular activities.  Do not have sex or orgasms until your health care provider says that this is safe. General instructions  Take over-the-counter and prescription medicines only as told by your health care provider.  Pay attention to any changes in your symptoms.  Do not use tampons or douche.  Write down how many pads you use each day, how often you change pads, and how soaked (saturated) they are.  If you pass any tissue from your vagina, save the tissue so you can show it to your health care provider.  Keep all follow-up visits as told by your health care provider. This is important. Contact a health care provider if:  You have vaginal bleeding during any part of your pregnancy.  You have cramps or labor pains.  You have a fever. Get help right away if:  You have severe cramps in your  back or abdomen.  You pass large clots or a large amount of tissue from your vagina.  Your bleeding increases.  You feel light-headed or weak, or you faint.  You have chills.  You are leaking fluid or have a gush of fluid from your vagina. Summary  A small amount of bleeding (spotting) from the vagina is relatively common during early pregnancy.  Various things may cause bleeding or spotting in early pregnancy.  Be sure to tell your health care provider about any vaginal bleeding right away. This information is not intended to replace advice given to you by your health care provider. Make sure you discuss any questions you have with your health care provider. Document Revised: 02/23/2019 Document Reviewed: 02/06/2017 Elsevier Patient Education  2020 [employer]  

## 2020-05-18 LAB — GC/CHLAMYDIA PROBE AMP (~~LOC~~) NOT AT ARMC
Chlamydia: NEGATIVE
Comment: NEGATIVE
Comment: NORMAL
Neisseria Gonorrhea: NEGATIVE

## 2020-06-21 ENCOUNTER — Other Ambulatory Visit: Payer: Self-pay | Admitting: Obstetrics & Gynecology

## 2020-06-21 DIAGNOSIS — Z3682 Encounter for antenatal screening for nuchal translucency: Secondary | ICD-10-CM

## 2020-06-21 DIAGNOSIS — Z3481 Encounter for supervision of other normal pregnancy, first trimester: Secondary | ICD-10-CM | POA: Insufficient documentation

## 2020-06-22 ENCOUNTER — Ambulatory Visit (INDEPENDENT_AMBULATORY_CARE_PROVIDER_SITE_OTHER): Payer: Self-pay

## 2020-06-22 ENCOUNTER — Ambulatory Visit: Payer: Self-pay | Admitting: Women's Health

## 2020-06-22 ENCOUNTER — Other Ambulatory Visit: Payer: Self-pay

## 2020-06-22 ENCOUNTER — Encounter: Payer: Self-pay | Admitting: Women's Health

## 2020-06-22 ENCOUNTER — Ambulatory Visit: Payer: Self-pay | Admitting: *Deleted

## 2020-06-22 VITALS — BP 112/63 | HR 75 | Wt 190.0 lb

## 2020-06-22 DIAGNOSIS — Z3481 Encounter for supervision of other normal pregnancy, first trimester: Secondary | ICD-10-CM

## 2020-06-22 DIAGNOSIS — Z331 Pregnant state, incidental: Secondary | ICD-10-CM

## 2020-06-22 DIAGNOSIS — Z3A12 12 weeks gestation of pregnancy: Secondary | ICD-10-CM

## 2020-06-22 DIAGNOSIS — Z3682 Encounter for antenatal screening for nuchal translucency: Secondary | ICD-10-CM

## 2020-06-22 DIAGNOSIS — Z131 Encounter for screening for diabetes mellitus: Secondary | ICD-10-CM

## 2020-06-22 DIAGNOSIS — Z1389 Encounter for screening for other disorder: Secondary | ICD-10-CM

## 2020-06-22 LAB — POCT URINALYSIS DIPSTICK OB
Blood, UA: NEGATIVE
Glucose, UA: NEGATIVE
Ketones, UA: NEGATIVE
Leukocytes, UA: NEGATIVE
Nitrite, UA: NEGATIVE
POC,PROTEIN,UA: NEGATIVE

## 2020-06-22 MED ORDER — BLOOD PRESSURE MONITOR MISC: 0 refills | Status: DC

## 2020-06-22 NOTE — Progress Notes (Signed)
[place] 12+3 wks,measurements c/w dates,crl 62.44 mm,NB present,NT 1.2 mm,anterior placenta gr 0,normal ovaries,simple right corpus luteal cyst 2.5 x 2.2 x 2.1 cm

## 2020-06-22 NOTE — Patient Instructions (Addendum)
Cheri Kearns, I greatly value your feedback.  If you receive a survey following your visit with [place] today, we appreciate you taking the time to fill it out.  Thanks, [name], CNM, WHNP-BC   [facility] [place] at [facility] ([place], [place] [address]) Entrance C, located off of [address] parking   For your lower back pain you may:  Purchase a pregnancy/maternity support belt from [employer], [employer], [employer], [employer], etc and wear it while you are up and about  Take warm baths  Use a heating pad to your lower back for no longer than 20 minutes at a time, and do not place near abdomen  Take tylenol as needed. Please follow directions on the bottle  Kinesiology tape (can get from sporting goods store), google how to tape belly for pregnancy    Nausea & Vomiting  Have saltine crackers or pretzels by your bed and eat a few bites before you raise your head out of bed in the morning  Eat small frequent meals throughout the day instead of large meals  Drink plenty of fluids throughout the day to stay hydrated, just don't drink a lot of fluids with your meals.  This can make your stomach fill up faster making you feel sick  Do not brush your teeth right after you eat  Products with real ginger are good for nausea, like ginger ale and ginger hard candy Make sure it says made with real ginger!  Sucking on sour candy like lemon heads is also good for nausea  If your prenatal vitamins make you nauseated, take them at night so you will sleep through the nausea  Sea Bands  If you feel like you need medicine for the nausea & vomiting please let us know  If you are unable to keep any fluids or food down please let us know   Constipation  Drink plenty of fluid, preferably water, throughout the day  Eat foods high in fiber such as fruits, vegetables, and grains  Exercise, such as walking, is a good way to keep your bowels  regular  Drink warm fluids, especially warm prune juice, or decaf coffee  Eat a 1/2 cup of real oatmeal (not instant), 1/2 cup applesauce, and 1/2-1 cup warm prune juice every day  If needed, you may take Colace (docusate sodium) stool softener once or twice a day to help keep the stool soft.   If you still are having problems with constipation, you may take Miralax once daily as needed to help keep your bowels regular.   Home Blood Pressure Monitoring for Patients   Your provider has recommended that you check your blood pressure (BP) at least once a week at home. If you do not have a blood pressure cuff at home, one will be provided for you. Contact your provider if you have not received your monitor within 1 week.   Helpful Tips for Accurate Home Blood Pressure Checks  . Don't smoke, exercise, or drink caffeine 30 minutes before checking your BP . Use the restroom before checking your BP (a full bladder can raise your pressure) . Relax in a comfortable upright chair . Feet on the ground . Left arm resting comfortably on a flat surface at the level of your heart . Legs uncrossed . Back supported . Sit quietly and don't talk . Place the cuff on your bare arm . Adjust snuggly, so that only two fingertips can fit  between your skin and the top of the cuff . Check 2 readings separated by at least one minute . Keep a log of your BP readings . For a visual, please reference this diagram: http://ccnc.care/bpdiagram  Provider Name: [facility]     Phone: [phone]  Zone 1: ALL CLEAR  Continue to monitor your symptoms:  . BP reading is less than 140 (top number) or less than 90 (bottom number)  . No right upper stomach pain . No headaches or seeing spots . No feeling nauseated or throwing up . No swelling in face and hands  Zone 2: CAUTION Call your doctor's office for any of the following:  . BP reading is greater than 140 (top number) or greater than 90 (bottom number)   . Stomach pain under your ribs in the middle or right side . Headaches or seeing spots . Feeling nauseated or throwing up . Swelling in face and hands  Zone 3: EMERGENCY  Seek immediate medical care if you have any of the following:  . BP reading is greater than160 (top number) or greater than 110 (bottom number) . Severe headaches not improving with Tylenol . Serious difficulty catching your breath . Any worsening symptoms from Zone 2    Primer trimestre de Media planner First Trimester of Pregnancy El primer trimestre de Media planner se extiende desde la semana1 hasta el final de la semana13 (mes1 al mes3). Una semana despus de que un espermatozoide fecunda un vulo, este se implantar en la pared uterina. Este embrin comenzar a Medical laboratory scientific officer convertirse en un beb. Sus genes y los de su pareja forman el beb. Los genes del varn determinan si ser un nio o una nia. Entre la semana6 y [place], se forman los ojos y Financial risk analyst, y los latidos del corazn pueden verse en una ecografa. Al final de las 12semanas, todos los rganos del beb estn formados. Ahora que est embarazada, querr hacer todo lo que est a su alcance para tener un beb sano. Dos de las cosas ms importantes son Raynelle Jan un buen cuidado prenatal y seguir las indicaciones del mdico. El cuidado prenatal incluye toda la asistencia mdica que usted recibe antes del nacimiento del beb. Esto ayudar a Chemical engineer, Hydrographic surveyor y tratar cualquier problema durante el embarazo y [place]. Durante Software engineer trimestre, ocurren cambios en el cuerpo. Su organismo atraviesa por muchos cambios durante el [place]. Estos cambios varan de [place] a [place].  Al principio, puede aumentar o bajar algunos kilos.  Puede tener Higher education careers adviser (nuseas) y vomitar. Si no puede controlar los vmitos, llame al mdico.  Puede cansarse con facilidad.  Es posible que tenga dolores de cabeza que pueden aliviarse con ciertos medicamentos. Todos los  [place] que tome deben estar aprobados por el mdico.  Puede orinar con mayor frecuencia. El dolor al orinar puede significar que usted tiene una infeccin de la vejiga.  Debido al [place], puede tener acidez estomacal.  Puede estar estreida, ya que ciertas hormonas enlentecen los movimientos de los msculos que empujan las heces a travs del intestino.  Pueden aparecer hemorroides o hincharse las venas (venas varicosas).  Las [employer] pueden empezar a Engineer, site y Scientist, forensic. Los pezones pueden sobresalir ms, y el tejido que los rodea (arola) tornarse ms oscuro.  Las Production manager y estar sensibles al cepillado y al hilo dental.  Pueden aparecer zonas oscuras o manchas (cloasma, mscara del [place]) en el rostro. Esto probablemente se atenuar despus del nacimiento del beb.  Los  perodos menstruales se interrumpirn.  Tal vez no tenga apetito.  Puede sentir un fuerte deseo de consumir ciertos alimentos.  Puede tener cambios a Engineer, site a da, por ejemplo, por momentos puede estar emocionada por el Media planner y por otros preocuparse porque algo pueda salir mal con el embarazo o el beb.  Tendr sueos ms vvidos y extraos.  Tal vez haya cambios en el cabello. Esto cambios pueden incluir su engrosamiento, crecimiento rpido y [employer]ley[employer]son textura. Adems, a algunas mujeres se les cae el cabello durante o despus del embarazo, o tienen el cabello seco o fino. Lo ms probable es que el cabello se le normalice despus del nacimiento del beb. Qu debe esperar en las visitas prenatales Durante una visita prenatal de rutina:  La pesarn para asegurarse de que usted y el beb estn creciendo normalmente.  Le tomarn la presin arterial.  Le medirn el abdomen para controlar el desarrollo del beb.  Se escucharn los latidos cardacos fetales entre las semanas10 y14 de [place].  Se analizarn los resultados de los estudios solicitados en visitas  anteriores. El mdico puede preguntarle lo siguiente:  Cmo se siente.  Si siente los movimientos del beb.  Si ha tenido sntomas anormales, como prdida de lquido, [place], dolores de cabeza intensos o clicos abdominales.  Si est consumiendo algn producto que contenga tabaco, como cigarrillos, tabaco de Higher education careers adviser y Psychologist, sport and exercise.  Si tiene [employer]. Otros estudios que pueden realizarse durante el primer trimestre incluyen lo siguiente:  Anlisis de sangre para determinar el grupo sanguneo y Hydrographic surveyor la presencia de infecciones previas. Las pruebas tambin se utilizarn para Teacher, adult education si tiene bajo nivel de hierro (anemia) y protenas en los glbulos rojos (anticuerpos Rh). En funcin de sus factores de riesgo, o si ya tuvo diabetes durante un embarazo anterior, le pueden hacer pruebas para determinar si tiene un nivel alto de azcar en la sangre, algo que puede afectar a embarazadas (diabetes gestacional).  Anlisis de orina para detectar infecciones, diabetes o protenas en la orina.  Una ecografa para confirmar que el beb crece y se desarrolla correctamente.  Estudios fetales para Hydrographic surveyor problemas de la mdula espinal (espina bfida) y sndrome de [place].  Prueba del VIH (virus de inmunodeficiencia humana). Los exmenes prenatales de rutina incluyen la prueba de deteccin del VIH, a menos que decida no Radiation protection practitioner.  Es posible que necesite otras pruebas adicionales. Siga estas instrucciones en su casa: [place] indicaciones del mdico en relacin con el uso de medicamentos. Durante el embarazo, hay medicamentos que pueden tomarse y 62 que no.  Tome vitaminas prenatales que contengan por lo menos [id number] (?g) de cido flico.  Si est estreida, tome un laxante suave, si el mdico lo autoriza. Comida y bebida   Gwenevere Ghazi dieta equilibrada que incluya gran cantidad de frutas y verduras frescas, cereales integrales, buenas fuentes  de protenas como carnes [place], huevos o tofu, y lcteos descremados. El mdico la ayudar a Office manager cantidad de peso que puede [place].  No coma carne cruda ni quesos sin cocinar. Estos elementos contienen grmenes que pueden causar defectos congnitos en el beb.  La ingesta diaria de cuatro o cinco comidas pequeas en lugar de tres comidas abundantes puede ayudar a [employer] nuseas y los vmitos. Si empieza a tener nuseas, comer algunas galletas saladas puede ser de [place]. Beber lquidos D[employer] Energy [employer], Engineer, technical sales de tomarlos durante las comidas, tambin puede ayudar a Public house manager las nuseas y los vmitos.  Limite el consumo de alimentos con alto contenido de grasas y azcares procesados, como alimentos fritos o dulces.  Para evitar el estreimiento: ? Consuma alimentos ricos en fibra, como frutas y verduras frescas, cereales integrales y frijoles. ? Beba suficiente lquido para mantener la orina clara o de color amarillo plido. Actividad  Haga ejercicio solamente como se lo haya indicado el mdico. La mayora de las mujeres pueden continuar su rutina de ejercicios durante el [place]. Intente realizar como mnimo 38minutos de actividad fsica por lo menos 5das a la semana. El ejercicio la ayudar a: ? Engineer, technical sales. ? Mantenerse en forma. ? Estar preparada para el trabajo de parto y [place].  Los dolores, los clicos en la parte baja del abdomen o los calambres en la cintura son un buen indicio de que debe dejar de Insurance risk surveyor. Consulte al mdico antes de seguir haciendo ejercicios con normalidad.  Intente no estar de pie [employer] Data Corporation. Mueva las piernas con frecuencia si debe estar de pie en un lugar durante mucho tiempo.  Evite levantar pesos [employer].  Use zapatos de tacones bajos y [place].  Puede seguir teniendo [employer], salvo que el mdico le indique lo contrario. Alivio del dolor y del Tree surgeon  Use un sostn que  le brinde buen soporte para Best boy de [place].  Dese baos de asiento con agua tibia para Best boy o las molestias causadas por las hemorroides. Use una crema para las hemorroides si el mdico la autoriza.  Descanse con las piernas elevadas si tiene calambres o dolor de cintura.  Si tiene venas varicosas en las piernas, use medias de descanso. Eleve los pies durante 90minutos, 3 o 4veces por da. Limite el consumo de sal en su dieta. Cuidados prenatales  Programe las visitas prenatales para la semana12 de [place]. Generalmente se programan cada mes al principio y se hacen ms frecuentes en los 2 ltimos meses antes del parto.  Escriba sus preguntas. Llvelas cuando concurra a las visitas prenatales.  Concurra a todas las visitas prenatales tal como se lo haya indicado el mdico. Esto es importante. Seguridad  Use el cinturn de seguridad en todo momento mientras conduce.  Haga una lista de los nmeros de telfono de Freight forwarder, que BJ's nmeros de telfono de familiares, [place], el hospital y los departamentos de polica y bomberos. Instrucciones generales  Pdale al mdico que la derive a clases de educacin prenatal en su localidad. Debe comenzar a tomar las clases antes de que empiece el mes6 de [place].  Pida ayuda si tiene necesidades nutricionales o de asesoramiento Solicitor. El mdico puede aconsejarla o derivarla a especialistas para que la ayuden con diferentes necesidades.  No se d baos de inmersin en agua caliente, baos turcos ni saunas.  No se haga duchas vaginales ni use tampones o toallas higinicas perfumadas.  No mantenga las piernas cruzadas durante mucho tiempo.  Evite el contacto con las bandejas sanitarias de los gatos y la tierra que estos animales usan. Estos elementos contienen bacterias que pueden causar defectos congnitos al beb y la posible prdida del feto debido a un aborto espontneo o muerte fetal.  No fume,  no consuma hierbas ni medicamentos que no hayan sido recetados por el mdico. Las sustancias qumicas que estos productos contienen afectan la formacin y el desarrollo del beb.  No consuma ningn producto que contenga nicotina o tabaco, como cigarrillos y Psychologist, sport and exercise. Si necesita ayuda para dejar de fumar, consulte  al mdico. Puede recibir asesoramiento y otro tipo de recursos para dejar de fumar.  Programe una cita con el dentista. En su casa, lvese los dientes con un cepillo dental blando y psese el hilo dental con suavidad. Comunquese con un mdico si:  Tiene mareos.  Siente clicos leves, presin en la pelvis o dolor persistente en el abdomen.  Tiene nuseas, vmitos o diarrea persistentes.  Margette Fast secrecin vaginal con mal olor.  Siente dolor al [employer] Airlines.  Observa ms hinchazn en la cara, las manos, las piernas o los tobillos.  Est expuesta a la quinta enfermedad o a la varicela.  Est expuesta al sarampin alemn (rubola) y nunca lo haba tenido. Solicite ayuda de inmediato si:  Tiene fiebre.  Tiene una prdida de lquido por la vagina.  Tiene sangrado o pequeas prdidas vaginales.  Siente dolor intenso o clicos en el abdomen.  Sube o baja de peso rpidamente.  Vomita sangre de color rojo brillante o una sustancia similar a los granos de caf.  Dolor de cabeza intenso.  Le falta el aire.  Sufre cualquier tipo de traumatismo, por ejemplo, debido a una cada o un accidente automovilstico. Resumen  El primer trimestre de Media planner se extiende desde la semana1 hasta el final de la semana13 (mes1 al mes3).  Su organismo atraviesa por muchos cambios durante el [place]. Estos cambios varan de [place] a [place].  Tendr visitas prenatales de rutina. Durante esas visitas, el mdico la examinar, hablar con usted acerca de los resultados de sus pruebas y Warehouse manager cmo se siente. Esta informacin no tiene Marine scientist el consejo  del mdico. Asegrese de hacerle al mdico cualquier pregunta que tenga. Document Revised: 02/07/2017 Document Reviewed: 02/07/2017 Elsevier Patient Education  [place].

## 2020-06-22 NOTE — Progress Notes (Signed)
INITIAL OBSTETRICAL VISIT Patient name: Ashlee Vincent MRN [id number]  Date of birth: [birth year] Chief Complaint:   Initial Prenatal Visit  History of Present Illness:   Ashlee Vincent is a 24 y.o. [id number] Hispanic female at [redacted]w[redacted]d by [place] at 7 weeks with an Estimated Date of Delivery: 01/01/21 being seen today for her initial obstetrical visit.   Her obstetrical history is significant for SAB x 2, term uncomplicated SVB x 3.   Today she reports some nausea- declines meds. Low back pain when up on feet a lot.  Depression screen [facility] 2/9 06/22/2020 12/08/2017  Decreased Interest 0 0  Down, Depressed, Hopeless 0 0  PHQ - 2 Score 0 0  Altered sleeping 1 0  Tired, decreased energy 1 0  Change in appetite 0 0  Feeling bad or failure about yourself  0 0  Trouble concentrating 0 0  Moving slowly or fidgety/restless 0 0  Suicidal thoughts 0 0  PHQ-9 Score 2 0    Patient's last menstrual period was 03/15/2020 (exact date). Last pap 01/07/18. Results were: normal Review of Systems:   Pertinent items are noted in HPI Denies cramping/contractions, leakage of fluid, vaginal bleeding, abnormal vaginal discharge w/ itching/odor/irritation, headaches, visual changes, shortness of breath, chest pain, abdominal pain, severe nausea/vomiting, or problems with urination or bowel movements unless otherwise stated above.  Pertinent History Reviewed:  Reviewed past medical,surgical, social, obstetrical and family history.  Reviewed problem list, medications and allergies. OB History  Gravida Para Term Preterm AB Living  6 3 3  0 2 3  SAB TAB Ectopic Multiple Live Births  2 0 0 0 3    # Outcome Date GA Lbr Len/2nd Weight Sex Delivery Anes PTL Lv  6 Current           5 Term 04/19/18 [redacted]w[redacted]d 14:05 / 00:17 6 lb 8.6 oz (2.965 kg) F Vag-Spont None  LIV     Birth Comments: none  4 Term 08/14/15 [redacted]w[redacted]d 18:30 / 00:16 7 lb 8.8 oz (3.425 kg) F Vag-Spont Local N LIV     Birth Comments: WNL  3 SAB 06/27/14 [redacted]w[redacted]d             Birth Comments: System Generated. Please review and update pregnancy details.  2 SAB 02/16/14          1 Term 02/21/10 [redacted]w[redacted]d  7 lb 14 oz (3.572 kg) F Vag-Spont None N LIV   Physical Assessment:   Vitals:   06/22/20 0934  BP: 112/63  Pulse: 75  Weight: 190 lb (86.2 kg)  Body mass index is 38.38 kg/m.       Physical Examination:  General appearance - well appearing, and in no distress  Mental status - alert, oriented to person, place, and time  Psych:  She has a normal mood and affect  Skin - warm and dry, normal color, no suspicious lesions noted  Chest - effort normal, all lung fields clear to auscultation bilaterally  Heart - normal rate and regular rhythm  Abdomen - soft, nontender  Extremities:  No swelling or varicosities noted  Thin prep pap is not done   TODAY'S NT [place] 12+3 wks,measurements c/w dates,crl 62.44 mm,NB present,NT 1.2 mm,anterior placenta gr 0,normal ovaries,simple right corpus luteal cyst 2.5 x 2.2 x 2.1 cm  Results for orders placed or performed in visit on 06/22/20 (from the past 24 hour(s))  POC Urinalysis Dipstick OB   Collection Time: 06/22/20  9:35 AM  Result Value Ref Range  Color, UA     Clarity, UA     Glucose, UA Negative Negative   Bilirubin, UA     Ketones, UA neg    Spec Grav, UA     Blood, UA neg    pH, UA     POC,PROTEIN,UA Negative Negative, Trace, Small (1+), Moderate (2+), Large (3+), 4+   Urobilinogen, UA     Nitrite, UA neg    Leukocytes, UA Negative Negative   Appearance     Odor      Assessment & Plan:  1) Low-Risk Pregnancy [id number] at [redacted]w[redacted]d with an Estimated Date of Delivery: 01/01/21   2) Initial OB visit  3) Nausea>declines meds, gave printed info  4) Low back pain> discussed, gave printed info  Meds:  Meds ordered this encounter  Medications  . Blood Pressure Monitor MISC    Sig: For regular home bp monitoring during pregnancy    Dispense:  1 each    Refill:  0    Z34.80    Initial labs  obtained Continue prenatal vitamins Reviewed n/v relief measures and warning s/s to report Reviewed recommended weight gain based on pre-gravid BMI Encouraged well-balanced diet Genetic & carrier screening discussed: requests Panorama, NT/IT and Horizon 14  Ultrasound discussed; fetal survey: requested CCNC completed> form faxed if has or is planning to apply for medicaid The nature of [employer] for [employer] with multiple MDs and other Advanced Practice Providers was explained to patient; also emphasized that fellows, residents, and students are part of our team. Does not have home bp cuff. Rx faxed to [facility]. Check bp weekly, let us know if >140/90.   Follow-up: Return in about 3 weeks (around 07/13/2020) for [place], 2nd IT, in person, CNM.   Orders Placed This Encounter  Procedures  . GC/Chlamydia Probe Amp  . Urine Culture  . Integrated 1  . Genetic Screening  . CBC/D/Plt+RPR+Rh+ABO+Rub Ab...  . Pain Management Screening Profile (10S)  . Hemoglobin A1c  . POC Urinalysis Dipstick OB    Roma Schanz CNM, [facility] 06/22/2020 10:38 AM

## 2020-06-23 LAB — PMP SCREEN PROFILE (10S), URINE
Amphetamine Scrn, Ur: NEGATIVE ng/mL
BARBITURATE SCREEN URINE: NEGATIVE ng/mL
BENZODIAZEPINE SCREEN, URINE: NEGATIVE ng/mL
CANNABINOIDS UR QL SCN: NEGATIVE ng/mL
Cocaine (Metab) Scrn, Ur: NEGATIVE ng/mL
Creatinine(Crt), U: 130.8 mg/dL (ref 20.0–300.0)
Methadone Screen, Urine: NEGATIVE ng/mL
OXYCODONE+OXYMORPHONE UR QL SCN: NEGATIVE ng/mL
Opiate Scrn, Ur: NEGATIVE ng/mL
Ph of Urine: 5.9 (ref 4.5–8.9)
Phencyclidine Qn, Ur: NEGATIVE ng/mL
Propoxyphene Scrn, Ur: NEGATIVE ng/mL

## 2020-06-23 LAB — HEMOGLOBIN A1C
Est. average glucose Bld gHb Est-mCnc: 100 mg/dL
Hgb A1c MFr Bld: 5.1 % (ref 4.8–5.6)

## 2020-06-24 LAB — HCV INTERPRETATION

## 2020-06-24 LAB — INTEGRATED 1
Crown Rump Length: 62.4 mm
Gest. Age on Collection Date: 12.4 weeks
Maternal Age at EDD: 25.2 yr
Nuchal Translucency (NT): 1.2 mm
Number of Fetuses: 1
PAPP-A Value: 386.7 ng/mL
Weight: 190 [lb_av]

## 2020-06-24 LAB — CBC/D/PLT+RPR+RH+ABO+RUB AB...
Antibody Screen: NEGATIVE
Basophils Absolute: 0 10*3/uL (ref 0.0–0.2)
Basos: 1 %
EOS (ABSOLUTE): 0.1 10*3/uL (ref 0.0–0.4)
Eos: 1 %
HCV Ab: <0.1 s/co ratio (ref 0.0–0.9)
HIV Screen 4th Generation wRfx: NONREACTIVE
Hematocrit: 40.4 % (ref 34.0–46.6)
Hemoglobin: 14.1 g/dL (ref 11.1–15.9)
Hepatitis B Surface Ag: NEGATIVE
Immature Grans (Abs): 0 10*3/uL (ref 0.0–0.1)
Immature Granulocytes: 1 %
Lymphocytes Absolute: 2.4 10*3/uL (ref 0.7–3.1)
Lymphs: 30 %
MCH: 32.7 pg (ref 26.6–33.0)
MCHC: 34.9 g/dL (ref 31.5–35.7)
MCV: 94 fL (ref 79–97)
Monocytes Absolute: 0.4 10*3/uL (ref 0.1–0.9)
Monocytes: 5 %
Neutrophils Absolute: 4.9 10*3/uL (ref 1.4–7.0)
Neutrophils: 62 %
Platelets: 263 10*3/uL (ref 150–450)
RBC: 4.31 x10E6/uL (ref 3.77–5.28)
RDW: 12.5 % (ref 11.7–15.4)
RPR Ser Ql: NONREACTIVE
Rh Factor: POSITIVE
Rubella Antibodies, IGG: 2.44 index (ref >0.99)
WBC: 8 10*3/uL (ref 3.4–10.8)

## 2020-06-24 LAB — URINE CULTURE: Organism ID, Bacteria: NO GROWTH

## 2020-06-25 LAB — GC/CHLAMYDIA PROBE AMP
Chlamydia trachomatis, NAA: NEGATIVE
Neisseria Gonorrhoeae by PCR: NEGATIVE

## 2020-07-04 ENCOUNTER — Other Ambulatory Visit: Payer: Self-pay | Admitting: *Deleted

## 2020-07-05 ENCOUNTER — Other Ambulatory Visit: Payer: Self-pay | Admitting: *Deleted

## 2020-07-05 ENCOUNTER — Other Ambulatory Visit: Payer: Self-pay

## 2020-07-05 NOTE — Progress Notes (Signed)
   NURSE VISIT- [facility]  SUBJECTIVE:  Ashlee Vincent is a 24 y.o. [phone] female here for Panorama NIPT  redraw. She is [redacted]w[redacted]d pregnant.   OBJECTIVE:  Appears well, in no apparent distress  Blood work drawn from right [facility] without difficulty. 1 attempt(s).   ASSESSMENT: Pregnancy [redacted]w[redacted]d Panorama NIPT redraw.  PLAN: [facility] portal information given and instructed patient how to access results Follow-up as scheduled  Alice Rieger  07/05/2020 10:31 AM

## 2020-07-13 ENCOUNTER — Ambulatory Visit (INDEPENDENT_AMBULATORY_CARE_PROVIDER_SITE_OTHER): Payer: Self-pay | Admitting: Advanced Practice Midwife

## 2020-07-13 ENCOUNTER — Encounter: Payer: Self-pay | Admitting: Advanced Practice Midwife

## 2020-07-13 VITALS — BP 108/67 | HR 71 | Wt 187.5 lb

## 2020-07-13 DIAGNOSIS — Z3A15 15 weeks gestation of pregnancy: Secondary | ICD-10-CM

## 2020-07-13 DIAGNOSIS — Z1389 Encounter for screening for other disorder: Secondary | ICD-10-CM

## 2020-07-13 DIAGNOSIS — Z331 Pregnant state, incidental: Secondary | ICD-10-CM

## 2020-07-13 DIAGNOSIS — Z3481 Encounter for supervision of other normal pregnancy, first trimester: Secondary | ICD-10-CM

## 2020-07-13 DIAGNOSIS — Z1379 Encounter for other screening for genetic and chromosomal anomalies: Secondary | ICD-10-CM

## 2020-07-13 DIAGNOSIS — Z363 Encounter for antenatal screening for malformations: Secondary | ICD-10-CM

## 2020-07-13 LAB — POCT URINALYSIS DIPSTICK OB
Blood, UA: NEGATIVE
Glucose, UA: NEGATIVE
Ketones, UA: NEGATIVE
Leukocytes, UA: NEGATIVE
Nitrite, UA: NEGATIVE
POC,PROTEIN,UA: NEGATIVE

## 2020-07-13 NOTE — Progress Notes (Signed)
Green vaginal discharge with no odor.

## 2020-07-13 NOTE — Patient Instructions (Signed)
Ashlee Vincent, I greatly value your feedback.  If you receive a survey following your visit with [place] today, we appreciate you taking the time to fill it out.  Thanks, Nigel Berthold, CNM     [place]!!! It is now [place] at [facility] ([place], [place] [address]) Entrance located off of [address] parking   Go to [email or web address] to register for FREE online childbirth classes    Second Trimester of Pregnancy The second trimester is from week 14 through week 27 (months 4 through 6). The second trimester is often a time when you feel your best. Your body has adjusted to being pregnant, and you begin to feel better physically. Usually, morning sickness has lessened or quit completely, you may have more energy, and you may have an increase in appetite. The second trimester is also a time when the fetus is growing rapidly. At the end of the sixth month, the fetus is about 9 inches long and weighs about 1 pounds. You will likely begin to feel the baby move (quickening) between 16 and 20 weeks of pregnancy. Body changes during your second trimester Your body continues to go through many changes during your second trimester. The changes vary from woman to woman.  Your weight will continue to increase. You will notice your lower abdomen bulging out.  You may begin to get stretch marks on your hips, abdomen, and breasts.  You may develop headaches that can be relieved by medicines. The medicines should be approved by your health care provider.  You may urinate more often because the fetus is pressing on your bladder.  You may develop or continue to have heartburn as a result of your pregnancy.  You may develop constipation because certain hormones are causing the muscles that push waste through your intestines to slow down.  You may develop hemorrhoids or swollen, bulging veins (varicose veins).  You may have  back pain. This is caused by: ? Weight gain. ? Pregnancy hormones that are relaxing the joints in your pelvis. ? A shift in weight and the muscles that support your balance.  Your breasts will continue to grow and they will continue to become tender.  Your gums may bleed and may be sensitive to brushing and flossing.  Dark spots or blotches (chloasma, mask of pregnancy) may develop on your face. This will likely fade after the baby is born.  A dark line from your belly button to the pubic area (linea nigra) may appear. This will likely fade after the baby is born.  You may have changes in your hair. These can include thickening of your hair, rapid growth, and changes in texture. Some women also have hair loss during or after pregnancy, or hair that feels dry or thin. Your hair will most likely return to normal after your baby is born.  What to expect at prenatal visits During a routine prenatal visit:  You will be weighed to make sure you and the fetus are growing normally.  Your blood pressure will be taken.  Your abdomen will be measured to track your baby's growth.  The fetal heartbeat will be listened to.  Any test results from the previous visit will be discussed.  Your health care provider may ask you:  How you are feeling.  If you are feeling the baby move.  If you have had any abnormal symptoms, such as leaking fluid, bleeding, severe headaches, or abdominal  cramping.  If you are using any tobacco products, including cigarettes, chewing tobacco, and electronic cigarettes.  If you have any questions.  Other tests that may be performed during your second trimester include:  Blood tests that check for: ? Low iron levels (anemia). ? High blood sugar that affects pregnant women (gestational diabetes) between 65 and 28 weeks. ? Rh antibodies. This is to check for a protein on red blood cells (Rh factor).  Urine tests to check for infections, diabetes, or protein in  the urine.  An ultrasound to confirm the proper growth and development of the baby.  An amniocentesis to check for possible genetic problems.  Fetal screens for spina bifida and Down syndrome.  HIV (human immunodeficiency virus) testing. Routine prenatal testing includes screening for HIV, unless you choose not to have this test.  Follow these instructions at home: Medicines  Follow your health care provider's instructions regarding medicine use. Specific medicines may be either safe or unsafe to take during pregnancy.  Take a prenatal vitamin that contains at least 600 micrograms (mcg) of folic acid.  If you develop constipation, try taking a stool softener if your health care provider approves. Eating and drinking  Eat a balanced diet that includes fresh fruits and vegetables, whole grains, good sources of protein such as meat, eggs, or tofu, and low-fat dairy. Your health care provider will help you determine the amount of weight gain that is right for you.  Avoid raw meat and uncooked cheese. These carry germs that can cause birth defects in the baby.  If you have low calcium intake from food, talk to your health care provider about whether you should take a daily calcium supplement.  Limit foods that are high in fat and processed sugars, such as fried and sweet foods.  To prevent constipation: ? Drink enough fluid to keep your urine clear or pale yellow. ? Eat foods that are high in fiber, such as fresh fruits and vegetables, whole grains, and beans. Activity  Exercise only as directed by your health care provider. Most women can continue their usual exercise routine during pregnancy. Try to exercise for 30 minutes at least 5 days a week. Stop exercising if you experience uterine contractions.  Avoid heavy lifting, wear low heel shoes, and practice good posture.  A sexual relationship may be continued unless your health care provider directs you otherwise. Relieving pain  and discomfort  Wear a good support bra to prevent discomfort from breast tenderness.  Take warm sitz baths to soothe any pain or discomfort caused by hemorrhoids. Use hemorrhoid cream if your health care provider approves.  Rest with your legs elevated if you have leg cramps or low back pain.  If you develop varicose veins, wear support hose. Elevate your feet for 15 minutes, 3-4 times a day. Limit salt in your diet. Prenatal Care  Write down your questions. Take them to your prenatal visits.  Keep all your prenatal visits as told by your health care provider. This is important. Safety  Wear your seat belt at all times when driving.  Make a list of emergency phone numbers, including numbers for family, friends, the hospital, and police and fire departments. General instructions  Ask your health care provider for a referral to a local prenatal education class. Begin classes no later than the beginning of month 6 of your pregnancy.  Ask for help if you have counseling or nutritional needs during pregnancy. Your health care provider can offer advice or refer  you to specialists for help with various needs.  Do not use hot tubs, steam rooms, or saunas.  Do not douche or use tampons or scented sanitary pads.  Do not cross your legs for long periods of time.  Avoid cat litter boxes and soil used by cats. These carry germs that can cause birth defects in the baby and possibly loss of the fetus by miscarriage or stillbirth.  Avoid all smoking, herbs, alcohol, and unprescribed drugs. Chemicals in these products can affect the formation and growth of the baby.  Do not use any products that contain nicotine or tobacco, such as cigarettes and e-cigarettes. If you need help quitting, ask your health care provider.  Visit your dentist if you have not gone yet during your pregnancy. Use a soft toothbrush to brush your teeth and be gentle when you floss. Contact a health care provider  if:  You have dizziness.  You have mild pelvic cramps, pelvic pressure, or nagging pain in the abdominal area.  You have persistent nausea, vomiting, or diarrhea.  You have a bad smelling vaginal discharge.  You have pain when you urinate. Get help right away if:  You have a fever.  You are leaking fluid from your vagina.  You have spotting or bleeding from your vagina.  You have severe abdominal cramping or pain.  You have rapid weight gain or weight loss.  You have shortness of breath with chest pain.  You notice sudden or extreme swelling of your face, hands, ankles, feet, or legs.  You have not felt your baby move in over an hour.  You have severe headaches that do not go away when you take medicine.  You have vision changes. Summary  The second trimester is from week 14 through week 27 (months 4 through 6). It is also a time when the fetus is growing rapidly.  Your body goes through many changes during pregnancy. The changes vary from woman to woman.  Avoid all smoking, herbs, alcohol, and unprescribed drugs. These chemicals affect the formation and growth your baby.  Do not use any tobacco products, such as cigarettes, chewing tobacco, and e-cigarettes. If you need help quitting, ask your health care provider.  Contact your health care provider if you have any questions. Keep all prenatal visits as told by your health care provider. This is important. This information is not intended to replace advice given to you by your health care provider. Make sure you discuss any questions you have with your health care provider.

## 2020-07-13 NOTE — Progress Notes (Signed)
   LOW-RISK PREGNANCY VISIT Patient name: Ashlee Vincent MRN [id number]  Date of birth: [birth year] Chief Complaint:   Routine Prenatal Visit (2nd IT)  History of Present Illness:   Ashlee Vincent is a 24 y.o. [id number] female at 101w3d with an Estimated Date of Delivery: 01/01/21 being seen today for ongoing management of a low-risk pregnancy.  Today she reports green vaginal DC. No odor, itch or irritation. Contractions: Not present. Vag. Bleeding: None.  Movement: Absent. denies leaking of fluid. Review of Systems:   Pertinent items are noted in HPI Denies abnormal vaginal discharge w/ itching/odor/irritation, headaches, visual changes, shortness of breath, chest pain, abdominal pain, severe nausea/vomiting, or problems with urination or bowel movements unless otherwise stated above. Pertinent History Reviewed:  Reviewed past medical,surgical, social, obstetrical and family history.  Reviewed problem list, medications and allergies. Physical Assessment:   Vitals:   07/13/20 1154  BP: 108/67  Pulse: 71  Weight: 187 lb 8 oz (85 kg)  Body mass index is 37.87 kg/m.        Physical Examination:   General appearance: Well appearing, and in no distress  Mental status: Alert, oriented to person, place, and time  Skin: Warm & dry  Cardiovascular: Normal heart rate noted  Respiratory: Normal respiratory effort, no distress  Abdomen: Soft, gravid, nontender  Pelvic:       normal appearing discharge without odor.    Extremities: Edema: None  Fetal Status: Fetal Heart Rate (bpm): 140   Movement: Absent    Chaperone: Diona Fanti    Results for orders placed or performed in visit on 07/13/20 (from the past 24 hour(s))  POC Urinalysis Dipstick OB   Collection Time: 07/13/20 11:59 AM  Result Value Ref Range   Color, UA     Clarity, UA     Glucose, UA Negative Negative   Bilirubin, UA     Ketones, UA neg    Spec Grav, UA     Blood, UA neg    pH, UA     POC,PROTEIN,UA Negative  Negative, Trace, Small (1+), Moderate (2+), Large (3+), 4+   Urobilinogen, UA     Nitrite, UA neg    Leukocytes, UA Negative Negative   Appearance     Odor      Assessment & Plan:  1) Low-risk pregnancy [id number] at [redacted]w[redacted]d with an Estimated Date of Delivery: 01/01/21   2) Normal vaginal dc,    Meds: No orders of the defined types were placed in this encounter.  Labs/procedures today: 2nd IT, Plan:  Continue routine obstetrical care  Next visit: prefers will be in person for [place]    Reviewed: Preterm labor symptoms and general obstetric precautions including but not limited to vaginal bleeding, contractions, leaking of fluid and fetal movement were reviewed in detail with the patient.  All questions were answered. Has home bp cuff. Check bp weekly, let us know if >140/90.   Follow-up: Return in about 3 weeks (around 08/03/2020) for WS:FKCLEXN, LROB.  Orders Placed This Encounter  Procedures  . US OB Comp + 14 Wk  . INTEGRATED 2  . POC Urinalysis Dipstick OB   Christin Fudge DNP, CNM 07/13/2020 12:32 PM

## 2020-07-17 ENCOUNTER — Telehealth: Payer: Self-pay | Admitting: Women's Health

## 2020-07-17 NOTE — Telephone Encounter (Signed)
Patient called stating that she would like to know if the results of natera has come in, pt states that she does not seen them. Please contact pt

## 2020-07-18 ENCOUNTER — Telehealth: Payer: Self-pay | Admitting: *Deleted

## 2020-07-18 NOTE — Telephone Encounter (Signed)
Patient informed results should be able to be viewed as they have just been released.

## 2020-07-19 LAB — INTEGRATED 2
AFP MoM: 0.98
Alpha-Fetoprotein: 25.5 ng/mL
Crown Rump Length: 62.4 mm
DIA MoM: 0.39
DIA Value: 54.6 pg/mL
Estriol, Unconjugated: 0.69 ng/mL
Gest. Age on Collection Date: 12.4 weeks
Gestational Age: 15.6 weeks
Maternal Age at EDD: 25.2 yr
Nuchal Translucency (NT): 1.2 mm
Nuchal Translucency MoM: 0.86
Number of Fetuses: 1
PAPP-A MoM: 0.53
PAPP-A Value: 386.7 ng/mL
Test Results:: NEGATIVE
Weight: 188 [lb_av]
Weight: 190 [lb_av]
hCG MoM: 0.32
hCG Value: 11 IU/mL
uE3 MoM: 0.87

## 2020-08-04 ENCOUNTER — Encounter: Payer: Self-pay | Admitting: Women's Health

## 2020-08-04 ENCOUNTER — Ambulatory Visit: Payer: Self-pay

## 2020-08-04 ENCOUNTER — Ambulatory Visit (INDEPENDENT_AMBULATORY_CARE_PROVIDER_SITE_OTHER): Payer: Self-pay | Admitting: Women's Health

## 2020-08-04 ENCOUNTER — Other Ambulatory Visit: Payer: Self-pay

## 2020-08-04 VITALS — BP 100/57 | HR 69 | Wt 188.6 lb

## 2020-08-04 DIAGNOSIS — Z331 Pregnant state, incidental: Secondary | ICD-10-CM

## 2020-08-04 DIAGNOSIS — Z3481 Encounter for supervision of other normal pregnancy, first trimester: Secondary | ICD-10-CM

## 2020-08-04 DIAGNOSIS — Z3A18 18 weeks gestation of pregnancy: Secondary | ICD-10-CM

## 2020-08-04 DIAGNOSIS — Z363 Encounter for antenatal screening for malformations: Secondary | ICD-10-CM

## 2020-08-04 DIAGNOSIS — Z1389 Encounter for screening for other disorder: Secondary | ICD-10-CM

## 2020-08-04 DIAGNOSIS — Z3482 Encounter for supervision of other normal pregnancy, second trimester: Secondary | ICD-10-CM

## 2020-08-04 LAB — POCT URINALYSIS DIPSTICK OB
Blood, UA: NEGATIVE
Glucose, UA: NEGATIVE
Ketones, UA: NEGATIVE
Leukocytes, UA: NEGATIVE
Nitrite, UA: NEGATIVE
POC,PROTEIN,UA: NEGATIVE

## 2020-08-04 NOTE — Progress Notes (Signed)
[place] 18+4 wks,breech,cx 3.9 cm,anterior placenta gr 0,fhr 132 bpm,svp of fluid 3.6 cm,efw 242 g 39%,anatomy complete,no obvious abnormalities

## 2020-08-04 NOTE — Progress Notes (Signed)
LOW-RISK PREGNANCY VISIT Patient name: Ashlee Vincent MRN [id number]  Date of birth: [birth year] Chief Complaint:   Routine Prenatal Visit ([place] today)  History of Present Illness:   Ashlee Vincent is a 24 y.o. [id number] female at [redacted]w[redacted]d with an Estimated Date of Delivery: 01/01/21 being seen today for ongoing management of a low-risk pregnancy.  Depression screen [facility] 2/9 06/22/2020 12/08/2017  Decreased Interest 0 0  Down, Depressed, Hopeless 0 0  PHQ - 2 Score 0 0  Altered sleeping 1 0  Tired, decreased energy 1 0  Change in appetite 0 0  Feeling bad or failure about yourself  0 0  Trouble concentrating 0 0  Moving slowly or fidgety/restless 0 0  Suicidal thoughts 0 0  PHQ-9 Score 2 0    Today she reports low back pain. Contractions: Not present. Vag. Bleeding: None.  Movement: Present. denies leaking of fluid. Review of Systems:   Pertinent items are noted in HPI Denies abnormal vaginal discharge w/ itching/odor/irritation, headaches, visual changes, shortness of breath, chest pain, abdominal pain, severe nausea/vomiting, or problems with urination or bowel movements unless otherwise stated above. Pertinent History Reviewed:  Reviewed past medical,surgical, social, obstetrical and family history.  Reviewed problem list, medications and allergies. Physical Assessment:   Vitals:   08/04/20 1212  BP: (!) 100/57  Pulse: 69  Weight: 188 lb 9.6 oz (85.5 kg)  Body mass index is 38.09 kg/m.        Physical Examination:   General appearance: Well appearing, and in no distress  Mental status: Alert, oriented to person, place, and time  Skin: Warm & dry  Cardiovascular: Normal heart rate noted  Respiratory: Normal respiratory effort, no distress  Abdomen: Soft, gravid, nontender  Pelvic: Cervical exam deferred         Extremities: Edema: None  Fetal Status:     Movement: Present   [place] 18+4 wks,breech,cx 3.9 cm,anterior placenta gr 0,fhr 132 bpm,svp of fluid 3.6 cm,efw 242 g  39%,anatomy complete,no obvious abnormalities   Chaperone: n/a    Results for orders placed or performed in visit on 08/04/20 (from the past 24 hour(s))  POC Urinalysis Dipstick OB   Collection Time: 08/04/20 12:13 PM  Result Value Ref Range   Color, UA     Clarity, UA     Glucose, UA Negative Negative   Bilirubin, UA     Ketones, UA neg    Spec Grav, UA     Blood, UA neg    pH, UA     POC,PROTEIN,UA Negative Negative, Trace, Small (1+), Moderate (2+), Large (3+), 4+   Urobilinogen, UA     Nitrite, UA neg    Leukocytes, UA Negative Negative   Appearance     Odor      Assessment & Plan:  1) Low-risk pregnancy [id number] at [redacted]w[redacted]d with an Estimated Date of Delivery: 01/01/21   2) Low back pain, gave printed prevention/relief measures    Meds: No orders of the defined types were placed in this encounter.  Labs/procedures today: anatomy u/s  Plan:  Continue routine obstetrical care  Next visit: prefers in person    Reviewed: Preterm labor symptoms and general obstetric precautions including but not limited to vaginal bleeding, contractions, leaking of fluid and fetal movement were reviewed in detail with the patient.  All questions were answered.  Follow-up: Return in about 4 weeks (around 09/01/2020) for [place], in person, CNM.  Orders Placed This Encounter  Procedures  . POC Urinalysis Dipstick OB  [place], [facility] 08/04/2020 12:46 PM

## 2020-08-04 NOTE — Patient Instructions (Addendum)
Ashlee Vincent, I greatly value your feedback.  If you receive a survey following your visit with [place] today, we appreciate you taking the time to fill it out.  Thanks, Ashlee Vincent, CNM, WHNP-BC  [facility] ([place], [place] [address]) Entrance C, located off of [address] [address] parking   For your lower back pain you may:  Purchase a pregnancy/maternity support belt from [employer], [employer], [employer], [employer], etc and wear it while you are up and about  Take warm baths  Use a heating pad to your lower back for no longer than 20 minutes at a time, and do not place near abdomen  Take tylenol as needed. Please follow directions on the bottle  Kinesiology tape (can get from sporting goods store), google how to tape belly for pregnancy   Go to [email or web address] to register for FREE online childbirth classes  [place] Pediatricians/Family Doctors:  [facility] [place] [phone]                 [place] [phone] (usually not accepting new patients unless you have family there already, you are always welcome to call and ask)       [facility] Department [phone]       [place] Pediatricians/Family Doctors:   [facility]: [phone]  [facility]: [phone]  [facility]: [place] Doctors:   [facility]: [facility]: [place]:  [place]: [phone]    Home Blood Pressure Monitoring for Patients   Your provider has recommended that you check your blood pressure (BP) at least once a week at home. If you do not have a blood pressure cuff at home, one will be provided for you. Contact your provider if you have not received your monitor within 1 week.    Helpful Tips for Accurate Home Blood Pressure Checks   Don't smoke, exercise, or drink caffeine 30 minutes before checking your BP  Use the restroom before checking your BP (a full bladder can raise your pressure)  Relax in a comfortable upright chair  Feet on the ground  Left arm resting comfortably on a flat surface at the level of your heart  Legs uncrossed  Back supported  Sit quietly and don't talk  Place the cuff on your bare arm  Adjust snuggly, so that only two fingertips can fit between your skin and the top of the cuff  Check 2 readings separated by at least one minute  Keep a log of your BP readings  For a visual, please reference this diagram: http://ccnc.care/bpdiagram  Provider Name: [facility]     Phone: [phone]  Zone 1: ALL CLEAR  Continue to monitor your symptoms:   BP reading is less than 140 (top number) or less than 90 (bottom number)   No right upper stomach pain  No headaches or seeing spots  No feeling nauseated or throwing up  No swelling in face and hands  Zone 2: CAUTION Call your doctor's office for any of the following:   BP reading is greater than 140 (top number) or greater than 90 (bottom number)   Stomach pain under your ribs in the middle or right side  Headaches or seeing spots  Feeling nauseated or throwing up  Swelling in face  and hands  Zone 3: EMERGENCY  Seek immediate medical care if you have any of the following:   BP reading is greater than160 (top number) or greater than 110 (bottom number)  Severe headaches not improving with Tylenol  Serious difficulty catching your breath  Any worsening symptoms from Zone 2    [place] trimestre de [employer] Trimester of Pregnancy El segundo trimestre va desde la semana14 hasta la 41, desde el cuarto hasta el sexto mes, y suele ser el momento en el que mejor se siente. Su organismo se ha adaptado a Public relations account executive, y comienza a Music therapist. En general, las nuseas matutinas han disminuido o han desaparecido completamente, puede tener ms energa y un aumento de apetito. El segundo trimestre es tambin la poca en la que el feto se desarrolla rpidamente. Hacia el final del sexto mes, el feto mide aproximadamente 9pulgadas (23cm) y pesa alrededor de 1 libras (700g). Es probable que sienta que el beb se Software engineer (da pataditas) entre las 50 y [place]. Cambios en el cuerpo durante el segundo trimestre Su cuerpo continua experimentando numerosos cambios durante su segundo trimestre. Estos cambios varan de [place] a [place].  Seguir [employer]. Notar que la parte baja del abdomen sobresale.  Podrn aparecer las primeras Apache Corporation caderas, el abdomen y las [place].  Es posible que tenga dolores de cabeza que pueden aliviarse con ciertos medicamentos. Los medicamentos que tome deben estar aprobados por el mdico.  Tal vez tenga necesidad de orinar con ms frecuencia porque el feto est ejerciendo presin sobre la vejiga.  Debido al Glennis Brink podr sentir Victorio Palm estomacal con frecuencia.  Puede estar estreida, ya que ciertas hormonas enlentecen los movimientos de los msculos que [employer] desechos a travs de los intestinos.  Pueden aparecer hemorroides o abultarse e hincharse las venas (venas varicosas).  Puede sentir dolor en la espalda. Esto se debe a: ? Aumento de peso. ? Las hormonas del [place] articulaciones en la pelvis. ? Un cambio en el peso y los msculos que ayudan a Theatre manager su equilibrio.  Sus pechos seguirn creciendo y se pondrn cada vez ms sensibles.  Las Production manager y estar sensibles al cepillado y al hilo dental.  Pueden aparecer zonas oscuras o manchas (cloasma, mscara del [place]) en el rostro. Esto probablemente se atenuar despus del nacimiento del beb.  Es posible que se forme una lnea oscura desde el ombligo hasta la zona del  pubis (linea nigra). Esto probablemente se atenuar despus del nacimiento del beb.  Tal vez haya cambios en el cabello. Esto cambios pueden incluir su engrosamiento, crecimiento rpido y [employer]ley[employer]son textura. Adems, a algunas mujeres se les cae el cabello durante o despus del embarazo, o tienen el cabello seco o fino. Lo ms probable es que el cabello se le normalice despus del nacimiento del beb. Qu debe esperar en las visitas prenatales Durante una visita prenatal de rutina:  La pesarn para asegurarse de que usted y el feto estn creciendo normalmente.  Le tomarn la presin arterial.  Le medirn el abdomen para controlar el desarrollo del beb.  Se escucharn los latidos cardacos fetales.  Se evaluarn los resultados de los estudios solicitados en visitas anteriores. El mdico puede preguntarle lo siguiente:  Cmo se siente.  Si siente los movimientos del beb.  Si ha tenido sntomas anormales, como prdida de lquido, [place], dolores de cabeza intensos o clicos abdominales.  Si est consumiendo algn producto  que contenga tabaco, como cigarrillos, tabaco de Higher education careers adviser y Psychologist, sport and exercise.  Si tiene [employer]oco. Otros estudios que podrn realizarse durante el segundo trimestre incluyen lo siguiente:  Anlisis de sangre para detectar lo siguiente: ? Concentraciones de hierro bajas (anemia). ? Nivel alto de azcar en la sangre que afecta a las mujeres embarazadas (diabetes gestacional) entre las semanas 24 y 23. ? Anticuerpos Rh. Esto es para detectar una protena en los glbulos rojos (factor Rh).  Anlisis de orina para detectar infecciones, diabetes o protenas en la orina.  Una ecografa para confirmar que el beb crece y se desarrolla correctamente.  Una amniocentesis para diagnosticar posibles problemas genticos.  Estudios del feto para descartar espina bfida y sndrome de Down.  Prueba del VIH (virus de inmunodeficiencia humana). Los exmenes  prenatales de rutina incluyen la prueba de deteccin del VIH, a menos que decida no Radiation protection practitioner. Siga estas indicaciones en su casa: [place] indicaciones del mdico en relacin con el uso de medicamentos. Durante el embarazo, hay medicamentos que pueden tomarse y 65 que no.  Tome vitaminas prenatales que contengan por lo menos [id number] (?g) de cido flico.  Si est estreida, tome un laxante suave, si el mdico lo autoriza. Qu debe comer y beber   Ill[place] Bolus una dieta equilibrada que incluya gran cantidad de frutas y verduras frescas, cereales integrales, buenas fuentes de protenas como carnes [place], huevos o tofu, y lcteos descremados. El mdico la ayudar a Office manager cantidad de peso que puede [place].  No coma carne cruda ni quesos sin cocinar. Estos elementos contienen grmenes que pueden causar defectos congnitos en el beb.  Si no consume muchos alimentos con calcio, hable con su mdico sobre si debera tomar un suplemento diario de calcio.  Limite el consumo de alimentos con alto contenido de grasas y azcares procesados, como alimentos fritos o dulces.  Para evitar el estreimiento: ? Bebe suficiente lquido para mantener la orina clara o de color amarillo plido. ? Consuma alimentos ricos en fibra, como frutas y verduras frescas, cereales integrales y frijoles. Actividad  Haga ejercicio solamente como se lo haya indicado el mdico. La mayora de las mujeres pueden continuar su rutina de ejercicios durante el [place]. Intente realizar como mnimo 63minutos de actividad fsica por lo menos 5das a la semana. Deje de hacer ejercicio si experimenta contracciones uterinas.  No levante objetos pesados, use zapatos de tacones bajos y mantenga una buena postura.  Puede seguir [employer] Electric Power, a menos que el mdico le indique lo contrario. Alivio del dolor y del Tree surgeon  Use un sostn que le brinde buen soporte para prevenir las  molestias causadas por la sensibilidad en los pechos.  Dese baos de asiento con agua tibia para Best boy o las molestias causadas por las hemorroides. Use una crema para las hemorroides si el mdico la autoriza.  Descanse con las piernas elevadas si tiene calambres o dolor de cintura.  Si tiene venas varicosas, use medias de descanso. Eleve los pies durante 18minutos, 3 o 4veces por da. Limite el consumo de sal en su dieta. Cuidados prenatales  Escriba sus preguntas. Llvelas cuando concurra a las visitas prenatales.  Concurra a todas las visitas prenatales tal como se lo haya indicado el mdico. Esto es importante. Seguridad  Use el cinturn de seguridad en todo momento mientras conduce.  Haga una lista de los nmeros de telfono de Freight forwarder, que BJ's nmeros de telfono de familiares, [place], el hospital y los departamentos de [place]  y bomberos. Instrucciones generales  Pdale al mdico que la derive a clases de educacin prenatal en su localidad. Debe comenzar a tomar las clases antes de que empiece el mes6 de [employer].  Pida ayuda si tiene necesidades nutricionales o de asesoramiento Solicitor. El mdico puede aconsejarla o derivarla a especialistas para que la ayuden con diferentes necesidades.  No se d baos de inmersin en agua caliente, baos turcos ni saunas.  No se haga duchas vaginales ni use tampones o toallas higinicas perfumadas.  No mantenga las piernas cruzadas durante mucho tiempo.  Evite el contacto con las bandejas sanitarias de los gatos y la tierra que estos animales usan. Estos elementos contienen bacterias que pueden causar defectos congnitos al beb y la posible prdida del feto debido a un aborto espontneo o muerte fetal.  Evite fumar, consumir hierbas, beber alcohol y tomar frmacos que no le hayan recetado. Las sustancias qumicas que estos productos contienen pueden afectar la formacin y el desarrollo del beb.  No  consuma ningn producto que contenga nicotina o tabaco, como cigarrillos y Psychologist, sport and exercise. Si necesita ayuda para dejar de fumar, consulte al [employer].  Visite a su dentista si an no lo ha [place] manager. Use un cepillo de dientes blando para higienizarse los dientes y psese el hilo dental con suavidad. Comunquese con un mdico si:  Tiene mareos.  Siente clicos leves, presin en la pelvis o dolor persistente en el abdomen.  Tiene nuseas, vmitos o diarrea persistentes.  Margette Fast secrecin vaginal con mal olor.  Siente dolor al Continental Airlines. Solicite ayuda de inmediato si:  Tiene fiebre.  Tiene una prdida de lquido por la vagina.  Tiene sangrado o pequeas prdidas vaginales.  Siente dolor intenso o clicos en el abdomen.  Sube de peso o baja de peso rpidamente.  Tiene dificultad para respirar y siente dolor de pecho.  Sbitamente se le hinchan mucho el rostro, las [place], los tobillos, los pies o las piernas.  No ha sentido los movimientos del beb durante Leone Brand.  Siente un dolor de cabeza intenso que no se alivia al tomar Dynegy.  Nota cambios en la visin. Resumen  El segundo trimestre va desde la semana14 hasta la 33, desde el cuarto hasta el sexto mes. Es tambin una poca en la que el feto se desarrolla rpidamente.  Su organismo atraviesa por muchos cambios durante el [place]. Estos cambios varan de [place] a [place].  Evite fumar, consumir hierbas, beber alcohol y tomar frmacos que no le hayan recetado. Estas sustancias qumicas afectan la formacin y el desarrollo de su beb.  No consuma ningn producto que contenga tabaco, lo que incluye cigarrillos, tabaco de Higher education careers adviser y Psychologist, sport and exercise. Si necesita ayuda para dejar de fumar, consulte al mdico.  Comunquese con su mdico si tiene preguntas sobre esto. Concurra a todas las visitas prenatales tal como se lo haya indicado el mdico. Esto es importante. Esta informacin no  tiene Marine scientist el consejo del mdico. Asegrese de hacerle al mdico cualquier pregunta que tenga. Document Revised: 03/17/2017 Document Reviewed: 03/17/2017 Elsevier Patient Education  [place].

## 2020-08-10 IMAGING — US US OB COMP LESS 14 WK
1 series · 15 of 28 positions shown · non-contrast
Comparison: None available.

CLINICAL DATA: Initial evaluation for acute vaginal bleeding,
pelvic pain, early pregnancy.

EXAM:
OBSTETRIC <14 WK ULTRASOUND
TECHNIQUE: Transabdominal ultrasound was performed for evaluation of the
gestation as well as the maternal uterus and adnexal regions.

[Series 1: us ob comp less 14 wk · 38 acquisitions, 15 frames shown]
[im 1/38]
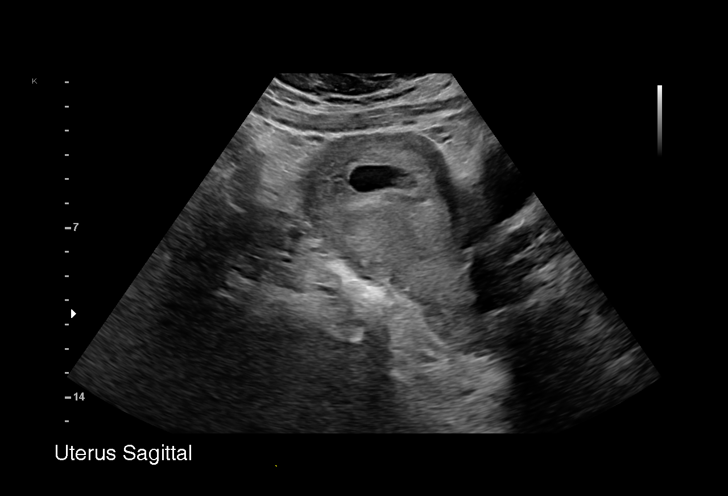
[im 3/38]
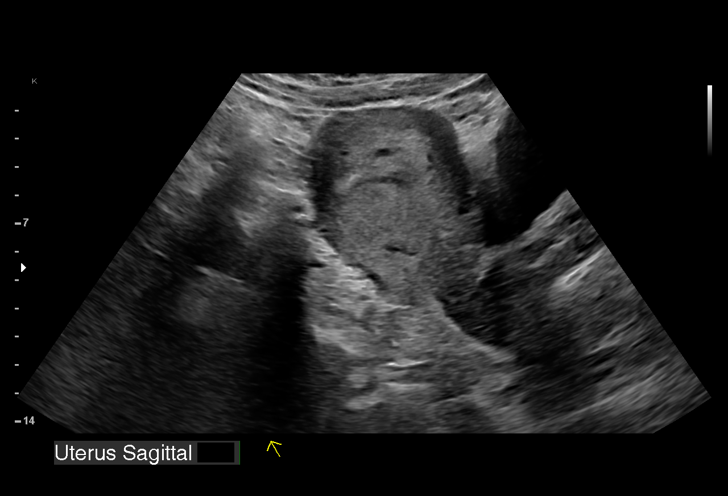
[im 6/38]
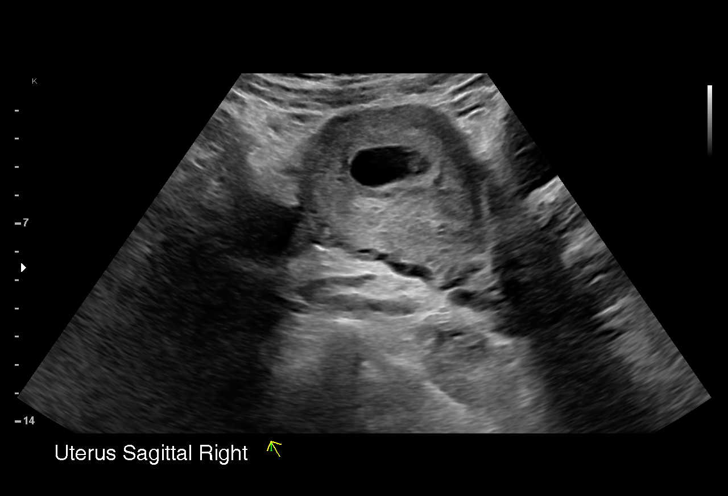
[im 9/38]
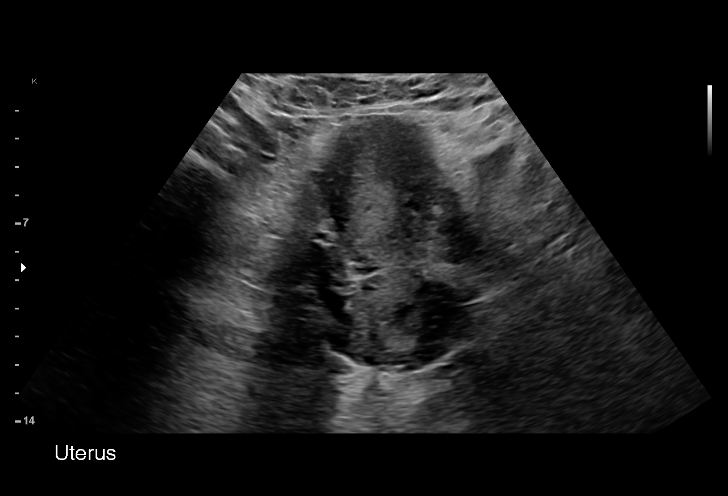
[im 11/38]
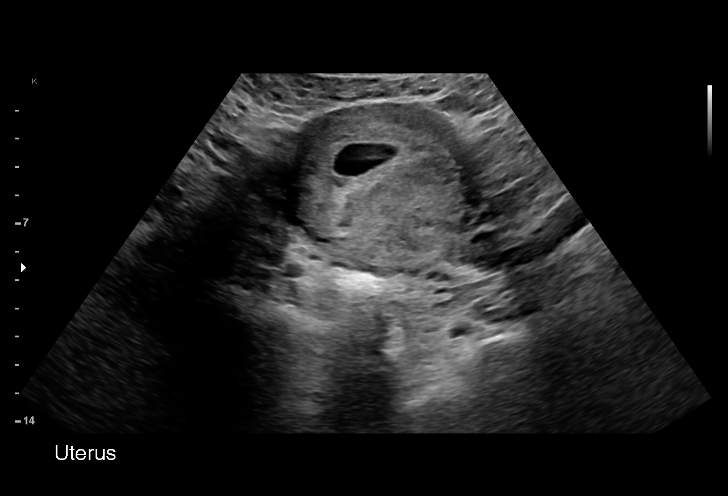
[im 14/38]
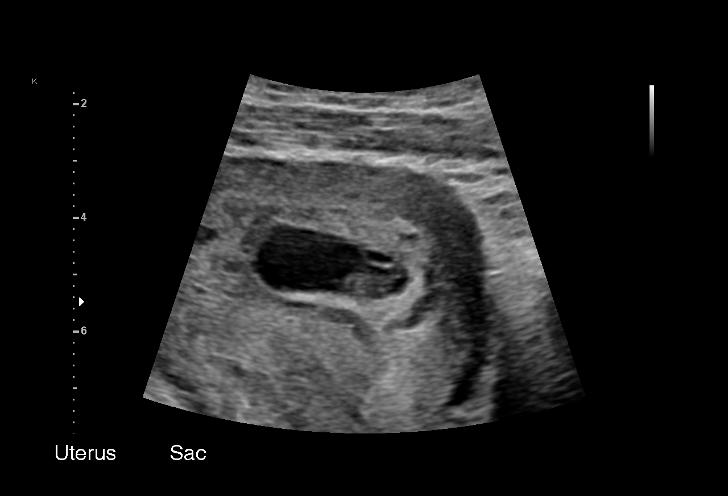
[im 17/38]
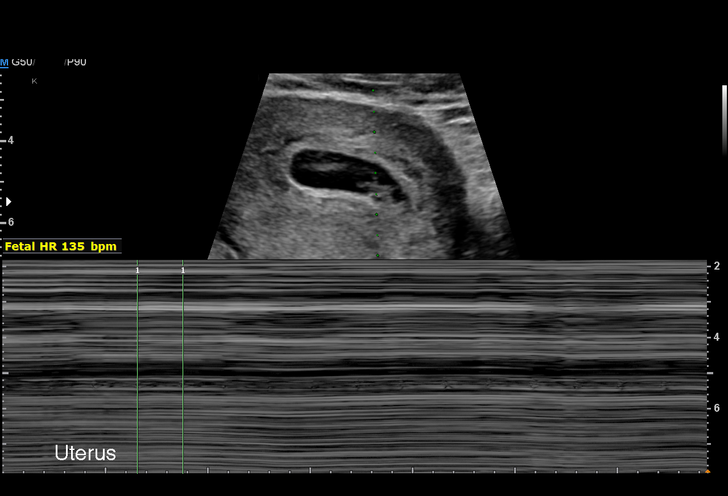
[im 20/38]
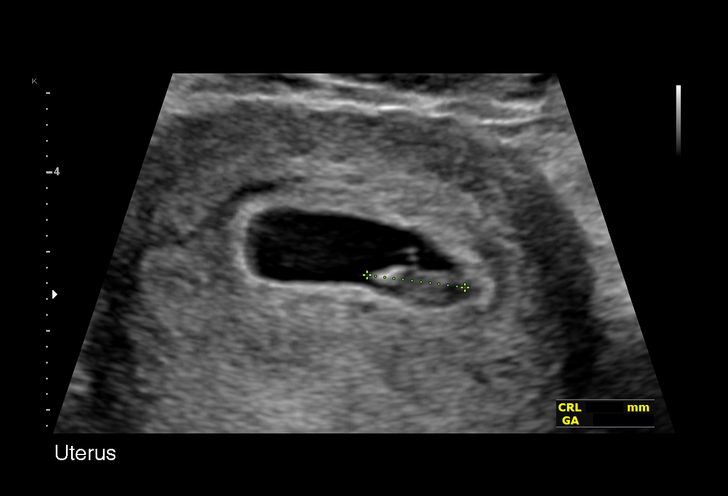
[im 21/38]
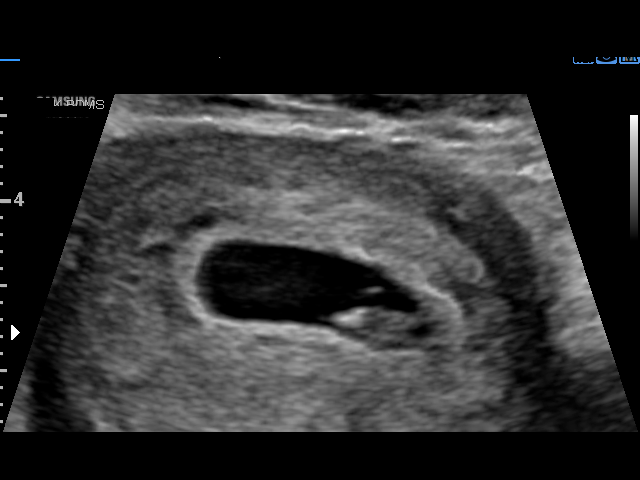
[im 24/38]
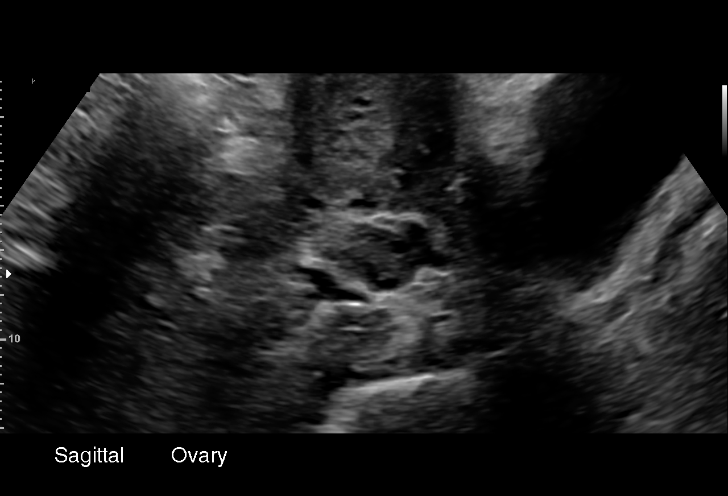
[im 27/38]
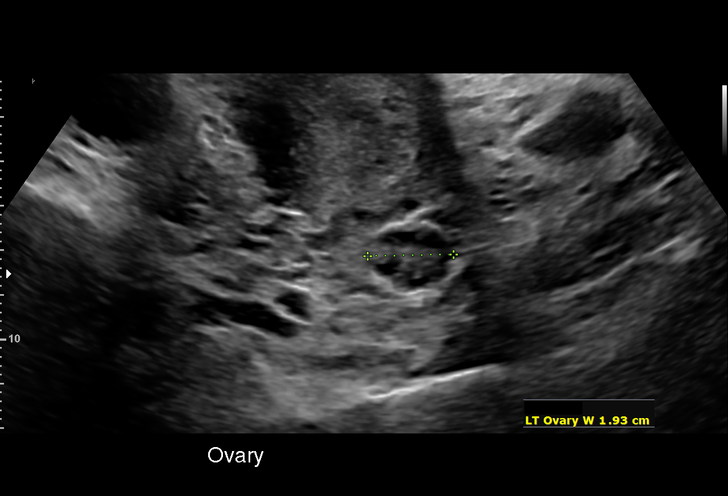
[im 29/38]
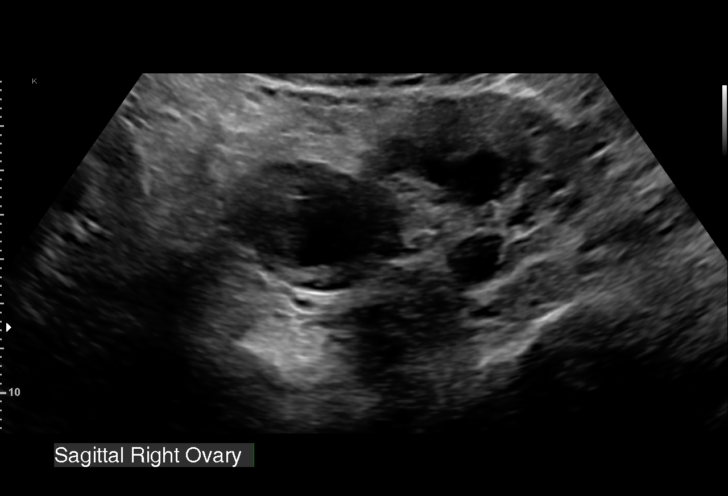
[im 32/38]
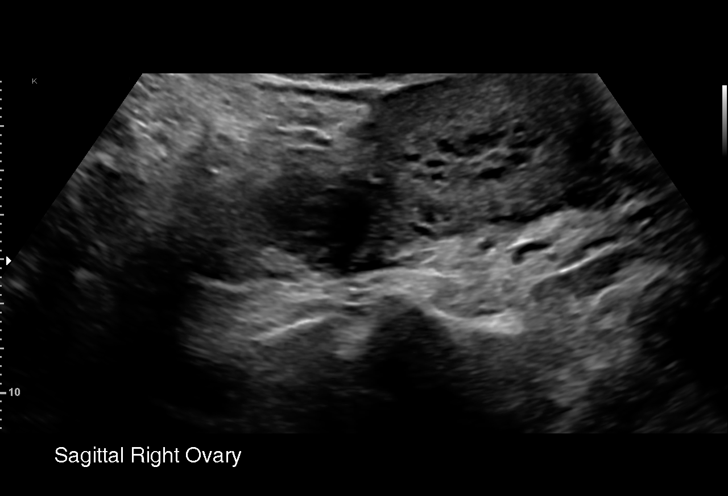
[im 35/38]
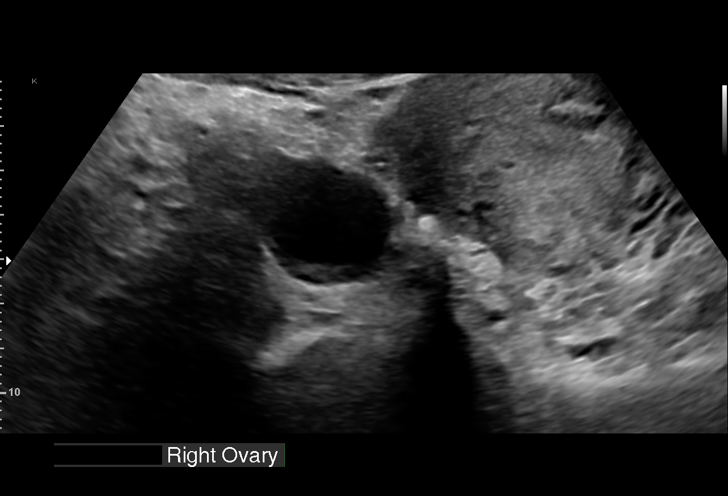
[im 38/38]
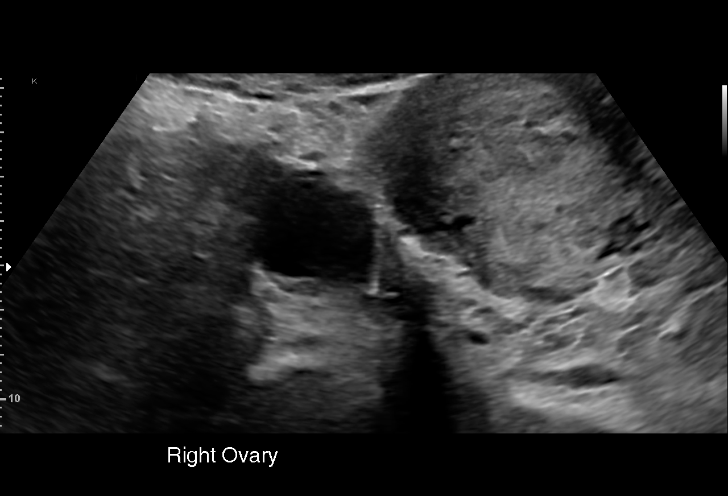

[15 of 28 positions shown; findings below may reference images not displayed]

FINDINGS: Intrauterine gestational sac: Single

Yolk sac:  Present

Embryo:  Present

Cardiac Activity: Present

Heart Rate: 135 bpm

CRL: 11.0 mm   7 w 1 d                  US EDC: 01/01/2021

Subchorionic hemorrhage:  None visualized.

Maternal uterus/adnexae: Ovaries within normal limits bilaterally.
2.6 x 2.1 x 2.6 cm degenerating corpus luteal cyst noted within the
right ovary. No free fluid.
IMPRESSION: 1. Single viable intrauterine pregnancy as above, estimated
gestational age 7 weeks and 1 day by crown-rump length, with
ultrasound EDC of 01/01/2021. No complication.
2. 2.6 cm degenerating right ovarian corpus luteal cyst.
3. No other acute maternal uterine or adnexal abnormality
identified.

## 2020-08-22 ENCOUNTER — Encounter: Payer: Self-pay | Admitting: *Deleted

## 2020-08-22 DIAGNOSIS — Z3481 Encounter for supervision of other normal pregnancy, first trimester: Secondary | ICD-10-CM

## 2020-09-01 ENCOUNTER — Encounter: Payer: Self-pay | Admitting: Women's Health

## 2020-09-15 ENCOUNTER — Encounter: Payer: Self-pay | Admitting: Women's Health

## 2020-09-22 ENCOUNTER — Encounter: Payer: Self-pay | Admitting: Advanced Practice Midwife

## 2020-09-22 ENCOUNTER — Ambulatory Visit (INDEPENDENT_AMBULATORY_CARE_PROVIDER_SITE_OTHER): Payer: Self-pay | Admitting: Advanced Practice Midwife

## 2020-09-22 ENCOUNTER — Other Ambulatory Visit: Payer: Self-pay

## 2020-09-22 VITALS — BP 112/66 | HR 90 | Wt 196.0 lb

## 2020-09-22 DIAGNOSIS — Z331 Pregnant state, incidental: Secondary | ICD-10-CM

## 2020-09-22 DIAGNOSIS — Z1389 Encounter for screening for other disorder: Secondary | ICD-10-CM

## 2020-09-22 DIAGNOSIS — Z3481 Encounter for supervision of other normal pregnancy, first trimester: Secondary | ICD-10-CM

## 2020-09-22 DIAGNOSIS — Z3A25 25 weeks gestation of pregnancy: Secondary | ICD-10-CM

## 2020-09-22 LAB — POCT URINALYSIS DIPSTICK OB
Blood, UA: NEGATIVE
Glucose, UA: NEGATIVE
Ketones, UA: NEGATIVE
Leukocytes, UA: NEGATIVE
Nitrite, UA: NEGATIVE
POC,PROTEIN,UA: NEGATIVE

## 2020-09-22 NOTE — Progress Notes (Signed)
   LOW-RISK PREGNANCY VISIT Patient name: Ashlee Vincent MRN [id number]  Date of birth: 07-20-1996 Chief Complaint:   Routine Prenatal Visit  History of Present Illness:   Ashlee Vincent is a 24 y.o. [id number] female at [redacted]w[redacted]d with an Estimated Date of Delivery: 01/01/21 being seen today for ongoing management of a low-risk pregnancy.  Today she reports questions re fetal movement and asking if ant placenta is a concern. Contractions: Not present. Vag. Bleeding: None.  Movement: Present. denies leaking of fluid. Review of Systems:   Pertinent items are noted in HPI Denies abnormal vaginal discharge w/ itching/odor/irritation, headaches, visual changes, shortness of breath, chest pain, abdominal pain, severe nausea/vomiting, or problems with urination or bowel movements unless otherwise stated above. Pertinent History Reviewed:  Reviewed past medical,surgical, social, obstetrical and family history.  Reviewed problem list, medications and allergies. Physical Assessment:   Vitals:   09/22/20 1156  BP: 112/66  Pulse: 90  Weight: 196 lb (88.9 kg)  Body mass index is 39.59 kg/m.        Physical Examination:   General appearance: Well appearing, and in no distress  Mental status: Alert, oriented to person, place, and time  Skin: Warm & dry  Cardiovascular: Normal heart rate noted  Respiratory: Normal respiratory effort, no distress  Abdomen: Soft, gravid, nontender  Pelvic: Cervical exam deferred         Extremities: Edema: None  Fetal Status: Fetal Heart Rate (bpm): 150 Fundal Height: 25 cm Movement: Present    Results for orders placed or performed in visit on 09/22/20 (from the past 24 hour(s))  POC Urinalysis Dipstick OB   Collection Time: 09/22/20 12:14 PM  Result Value Ref Range   Color, UA     Clarity, UA     Glucose, UA Negative Negative   Bilirubin, UA     Ketones, UA neg    Spec Grav, UA     Blood, UA neg    pH, UA     POC,PROTEIN,UA Negative Negative, Trace, Small  (1+), Moderate (2+), Large (3+), 4+   Urobilinogen, UA     Nitrite, UA neg    Leukocytes, UA Negative Negative   Appearance     Odor      Assessment & Plan:  1) Low-risk pregnancy [id number] at [redacted]w[redacted]d with an Estimated Date of Delivery: 01/01/21     Meds: No orders of the defined types were placed in this encounter.  Labs/procedures today: none  Plan:  Continue routine obstetrical care with PN2 at next visit  Reviewed: Preterm labor symptoms and general obstetric precautions including but not limited to vaginal bleeding, contractions, leaking of fluid and fetal movement were reviewed in detail with the patient.  All questions were answered. Was given home bp cuff today.  Check bp weekly, let us know if >140/90.   Follow-up: Return in about 2 weeks (around 10/06/2020) for LROB, PN2, in person.  Orders Placed This Encounter  Procedures  . POC Urinalysis Dipstick OB   Myrtis Ser CNM 09/22/2020 12:30 PM

## 2020-09-22 NOTE — Patient Instructions (Signed)
Ashlee Vincent, I greatly value your feedback.  If you receive a survey following your visit with [place] today, we appreciate you taking the time to fill it out.  Thanks, [name], CNM   You will have your sugar test next visit.  Please do not eat or drink anything after midnight the night before you come, not even water.  You will be here for at least two hours.  Please make an appointment online for the bloodwork at [email or web address] for 8:30am (or as close to this as possible). Make sure you select the [facility] service center. The day of the appointment, check in with our office first, then you will go to [place] to start the sugar test.    [place]!!! It is now [place] at [facility] ([place], [place] [address]) Entrance C, located off of [address] parking  Go to [email or web address] to register for FREE online childbirth classes   Call the office [phone]) or go to [facility] if:  You begin to have strong, frequent contractions  Your water breaks.  Sometimes it is a big gush of fluid, sometimes it is just a trickle that keeps getting your panties wet or running down your legs  You have vaginal bleeding.  It is normal to have a small amount of spotting if your cervix was checked.   You don't feel your baby moving like normal.  If you don't, get you something to eat and drink and lay down and focus on feeling your baby move.   If your baby is still not moving like normal, you should call the office or go to [place] Pediatricians/Family Doctors:  [place] [phone]                 [place] [phone] (usually not accepting new patients unless you have family there already, you are always welcome to call and ask)       [facility] Department [phone]       [facility] Pediatricians/Family Doctors:    [facility]: [phone]  [facility]: [phone]  [facility]: [place] Doctors:   [facility]: [facility]: [place]:  [place]: [phone]   Home Blood Pressure Monitoring for Patients   Your provider has recommended that you check your blood pressure (BP) at least once a week at home. If you do not have a blood pressure cuff at home, one will be provided for you. Contact your provider if you have not received your monitor within 1 week.   Helpful Tips for Accurate Home Blood Pressure Checks  . Don't smoke, exercise, or drink caffeine 30 minutes before checking your BP . Use the restroom before checking your BP (a full bladder can raise your pressure) . Relax in a comfortable upright chair . Feet on the ground . Left arm resting comfortably on a flat surface at the level of your heart . Legs uncrossed . Back supported . Sit quietly and don't talk . Place the cuff on your bare arm . Adjust snuggly, so that only two fingertips can fit between your skin and the top of the cuff . Check 2 readings separated by at least one minute . Keep a log of your BP readings .  For a visual, please reference this diagram: http://ccnc.care/bpdiagram  Provider Name: [facility]     Phone: [phone]  Zone 1: ALL CLEAR  Continue to monitor your symptoms:  . BP reading is less than 140 (top number) or less than 90 (bottom number)  . No right upper stomach pain . No headaches or seeing spots . No feeling nauseated or throwing up . No swelling in face and hands  Zone 2: CAUTION Call your doctor's office for any of the following:  . BP reading is greater than 140 (top number) or greater than 90 (bottom number)  . Stomach pain under your ribs in the middle or right side . Headaches or seeing spots . Feeling  nauseated or throwing up . Swelling in face and hands  Zone 3: EMERGENCY  Seek immediate medical care if you have any of the following:  . BP reading is greater than160 (top number) or greater than 110 (bottom number) . Severe headaches not improving with Tylenol . Serious difficulty catching your breath . Any worsening symptoms from Zone 2   Second Trimester of Pregnancy The second trimester is from week 13 through week 28, months 4 through 6. The second trimester is often a time when you feel your best. Your body has also adjusted to being pregnant, and you begin to feel better physically. Usually, morning sickness has lessened or quit completely, you may have more energy, and you may have an increase in appetite. The second trimester is also a time when the fetus is growing rapidly. At the end of the sixth month, the fetus is about 9 inches long and weighs about 1 pounds. You will likely begin to feel the baby move (quickening) between 18 and 20 weeks of the pregnancy. BODY CHANGES Your body goes through many changes during pregnancy. The changes vary from woman to woman.   Your weight will continue to increase. You will notice your lower abdomen bulging out.  You may begin to get stretch marks on your hips, abdomen, and breasts.  You may develop headaches that can be relieved by medicines approved by your health care provider.  You may urinate more often because the fetus is pressing on your bladder.  You may develop or continue to have heartburn as a result of your pregnancy.  You may develop constipation because certain hormones are causing the muscles that push waste through your intestines to slow down.  You may develop hemorrhoids or swollen, bulging veins (varicose veins).  You may have back pain because of the weight gain and pregnancy hormones relaxing your joints between the bones in your pelvis and as a result of a shift in weight and the muscles that support your  balance.  Your breasts will continue to grow and be tender.  Your gums may bleed and may be sensitive to brushing and flossing.  Dark spots or blotches (chloasma, mask of pregnancy) may develop on your face. This will likely fade after the baby is born.  A dark line from your belly button to the pubic area (linea nigra) may appear. This will likely fade after the baby is born.  You may have changes in your hair. These can include thickening of your hair, rapid growth, and changes in texture. Some women also have hair loss during or after pregnancy, or hair that feels dry or thin. Your hair will most likely return to normal after your baby is born. WHAT TO EXPECT AT YOUR PRENATAL VISITS During a routine  prenatal visit:  You will be weighed to make sure you and the fetus are growing normally.  Your blood pressure will be taken.  Your abdomen will be measured to track your baby's growth.  The fetal heartbeat will be listened to.  Any test results from the previous visit will be discussed. Your health care provider may ask you:  How you are feeling.  If you are feeling the baby move.  If you have had any abnormal symptoms, such as leaking fluid, bleeding, severe headaches, or abdominal cramping.  If you have any questions. Other tests that may be performed during your second trimester include:  Blood tests that check for:  Low iron levels (anemia).  Gestational diabetes (between 24 and 28 weeks).  Rh antibodies.  Urine tests to check for infections, diabetes, or protein in the urine.  An ultrasound to confirm the proper growth and development of the baby.  An amniocentesis to check for possible genetic problems.  Fetal screens for spina bifida and Down syndrome. HOME CARE INSTRUCTIONS   Avoid all smoking, herbs, alcohol, and unprescribed drugs. These chemicals affect the formation and growth of the baby.  Follow your health care provider's instructions regarding  medicine use. There are medicines that are either safe or unsafe to take during pregnancy.  Exercise only as directed by your health care provider. Experiencing uterine cramps is a good sign to stop exercising.  Continue to eat regular, healthy meals.  Wear a good support bra for breast tenderness.  Do not use hot tubs, steam rooms, or saunas.  Wear your seat belt at all times when driving.  Avoid raw meat, uncooked cheese, cat litter boxes, and soil used by cats. These carry germs that can cause birth defects in the baby.  Take your prenatal vitamins.  Try taking a stool softener (if your health care provider approves) if you develop constipation. Eat more high-fiber foods, such as fresh vegetables or fruit and whole grains. Drink plenty of fluids to keep your urine clear or pale yellow.  Take warm sitz baths to soothe any pain or discomfort caused by hemorrhoids. Use hemorrhoid cream if your health care provider approves.  If you develop varicose veins, wear support hose. Elevate your feet for 15 minutes, 3-4 times a day. Limit salt in your diet.  Avoid heavy lifting, wear low heel shoes, and practice good posture.  Rest with your legs elevated if you have leg cramps or low back pain.  Visit your dentist if you have not gone yet during your pregnancy. Use a soft toothbrush to brush your teeth and be gentle when you floss.  A sexual relationship may be continued unless your health care provider directs you otherwise.  Continue to go to all your prenatal visits as directed by your health care provider. SEEK MEDICAL CARE IF:   You have dizziness.  You have mild pelvic cramps, pelvic pressure, or nagging pain in the abdominal area.  You have persistent nausea, vomiting, or diarrhea.  You have a bad smelling vaginal discharge.  You have pain with urination. SEEK IMMEDIATE MEDICAL CARE IF:   You have a fever.  You are leaking fluid from your vagina.  You have spotting or  bleeding from your vagina.  You have severe abdominal cramping or pain.  You have rapid weight gain or loss.  You have shortness of breath with chest pain.  You notice sudden or extreme swelling of your face, hands, ankles, feet, or legs.  You have not  felt your baby move in over an hour.  You have severe headaches that do not go away with medicine.  You have vision changes. Document Released: 10/29/2001 Document Revised: 11/09/2013 Document Reviewed: 01/05/2013 [facility] Patient Information 2015 [place], [place]. This information is not intended to replace advice given to you by your health care provider. Make sure you discuss any questions you have with your health care provider.

## 2020-10-06 ENCOUNTER — Other Ambulatory Visit: Payer: Self-pay

## 2020-10-06 ENCOUNTER — Encounter: Payer: Self-pay | Admitting: Obstetrics & Gynecology

## 2020-10-06 DIAGNOSIS — Z3A27 27 weeks gestation of pregnancy: Secondary | ICD-10-CM

## 2020-10-06 DIAGNOSIS — Z3483 Encounter for supervision of other normal pregnancy, third trimester: Secondary | ICD-10-CM

## 2020-10-20 ENCOUNTER — Encounter: Payer: Self-pay | Admitting: Obstetrics & Gynecology

## 2020-10-20 ENCOUNTER — Other Ambulatory Visit: Payer: Self-pay

## 2020-10-20 DIAGNOSIS — Z3A29 29 weeks gestation of pregnancy: Secondary | ICD-10-CM

## 2020-10-20 DIAGNOSIS — Z3483 Encounter for supervision of other normal pregnancy, third trimester: Secondary | ICD-10-CM

## 2020-11-02 ENCOUNTER — Encounter: Payer: Self-pay | Admitting: Advanced Practice Midwife

## 2020-11-02 ENCOUNTER — Other Ambulatory Visit: Payer: Self-pay

## 2020-11-03 LAB — GLUCOSE TOLERANCE, 2 HOURS W/ 1HR
Glucose, 1 hour: 91 mg/dL (ref 65–179)
Glucose, 2 hour: 92 mg/dL (ref 65–152)
Glucose, Fasting: 80 mg/dL (ref 65–91)

## 2020-11-03 LAB — CBC
Hematocrit: 33.7 % — ABNORMAL LOW (ref 34.0–46.6)
Hemoglobin: 11.3 g/dL (ref 11.1–15.9)
MCH: 30.8 pg (ref 26.6–33.0)
MCHC: 33.5 g/dL (ref 31.5–35.7)
MCV: 92 fL (ref 79–97)
Platelets: 166 10*3/uL (ref 150–450)
RBC: 3.67 x10E6/uL — ABNORMAL LOW (ref 3.77–5.28)
RDW: 12.9 % (ref 11.7–15.4)
WBC: 7.5 10*3/uL (ref 3.4–10.8)

## 2020-11-03 LAB — HIV ANTIBODY (ROUTINE TESTING W REFLEX): HIV Screen 4th Generation wRfx: NONREACTIVE

## 2020-11-03 LAB — RPR: RPR Ser Ql: NONREACTIVE

## 2020-11-03 LAB — ANTIBODY SCREEN: Antibody Screen: NEGATIVE

## 2020-11-06 ENCOUNTER — Ambulatory Visit (INDEPENDENT_AMBULATORY_CARE_PROVIDER_SITE_OTHER): Payer: Self-pay | Admitting: Advanced Practice Midwife

## 2020-11-06 ENCOUNTER — Other Ambulatory Visit: Payer: Self-pay

## 2020-11-06 VITALS — BP 102/68 | HR 87 | Wt 204.0 lb

## 2020-11-06 DIAGNOSIS — O368130 Decreased fetal movements, third trimester, not applicable or unspecified: Secondary | ICD-10-CM

## 2020-11-06 DIAGNOSIS — Z3A32 32 weeks gestation of pregnancy: Secondary | ICD-10-CM

## 2020-11-06 DIAGNOSIS — Z331 Pregnant state, incidental: Secondary | ICD-10-CM

## 2020-11-06 DIAGNOSIS — Z3483 Encounter for supervision of other normal pregnancy, third trimester: Secondary | ICD-10-CM

## 2020-11-06 DIAGNOSIS — Z1389 Encounter for screening for other disorder: Secondary | ICD-10-CM

## 2020-11-06 LAB — POCT URINALYSIS DIPSTICK OB
Blood, UA: NEGATIVE
Glucose, UA: NEGATIVE
Ketones, UA: NEGATIVE
Leukocytes, UA: NEGATIVE
Nitrite, UA: NEGATIVE
POC,PROTEIN,UA: NEGATIVE

## 2020-11-06 NOTE — Progress Notes (Signed)
   LOW-RISK PREGNANCY VISIT Patient name: Ashlee Vincent MRN [id number]  Date of birth: [birth year] Chief Complaint:   Routine Prenatal Visit  History of Present Illness:   Ashlee Vincent is a 24 y.o. [id number] female at [redacted]w[redacted]d with an Estimated Date of Delivery: 01/01/21 being seen today for ongoing management of a low-risk pregnancy.  Today she reports no complaints. Contractions: Irregular. Vag. Bleeding: None.  Movement: (!) Decreased. denies leaking of fluid. Review of Systems:   Pertinent items are noted in HPI Denies abnormal vaginal discharge w/ itching/odor/irritation, headaches, visual changes, shortness of breath, chest pain, abdominal pain, severe nausea/vomiting, or problems with urination or bowel movements unless otherwise stated above. Pertinent History Reviewed:  Reviewed past medical,surgical, social, obstetrical and family history.  Reviewed problem list, medications and allergies. Physical Assessment:   Vitals:   11/06/20 1442  BP: 102/68  Pulse: 87  Weight: 204 lb (92.5 kg)  Body mass index is 41.2 kg/m.        Physical Examination:   General appearance: Well appearing, and in no distress  Mental status: Alert, oriented to person, place, and time  Skin: Warm & dry  Cardiovascular: Normal heart rate noted  Respiratory: Normal respiratory effort, no distress  Abdomen: Soft, gravid, nontender  Pelvic: Cervical exam deferred         Extremities: Edema: None  Fetal Status:     Movement: (!) Decreased   NST: FHR baseline 130 bpm, Variability: moderate, Accelerations:present, Decelerations:  Absent= Cat 1/Reactive  Chaperone: n/a    Results for orders placed or performed in visit on 11/06/20 (from the past 24 hour(s))  POC Urinalysis Dipstick OB   Collection Time: 11/06/20  2:47 PM  Result Value Ref Range   Color, UA     Clarity, UA     Glucose, UA Negative Negative   Bilirubin, UA     Ketones, UA neg    Spec Grav, UA     Blood, UA neg    pH, UA      POC,PROTEIN,UA Negative Negative, Trace, Small (1+), Moderate (2+), Large (3+), 4+   Urobilinogen, UA     Nitrite, UA neg    Leukocytes, UA Negative Negative   Appearance     Odor      Assessment & Plan:  1) Low-risk pregnancy [id number] at [redacted]w[redacted]d with an Estimated Date of Delivery: 01/01/21   2) Decreased FM, reactive NST, + active in office   Meds: No orders of the defined types were placed in this encounter.  Labs/procedures today: NST  Plan:  Continue routine obstetrical care  Next visit: prefers online    Reviewed: Preterm labor symptoms and general obstetric precautions including but not limited to vaginal bleeding, contractions, leaking of fluid and fetal movement were reviewed in detail with the patient.  All questions were answered. Has home bp cuff. Check bp weekly, let us know if >140/90.   Follow-up: Return in about 2 weeks (around 11/20/2020) for [place].  Orders Placed This Encounter  Procedures  . POC Urinalysis Dipstick OB   Christin Fudge DNP, CNM 11/06/2020 3:24 PM

## 2020-11-18 NOTE — L&D Delivery Note (Addendum)
Delivery Note At 4:51 AM a viable and healthy female was delivered via Vaginal, Spontaneous (Presentation: Right Occiput Anterior).  APGAR: , ; weight  .   Placenta status: Spontaneous, Intact.  Cord: 3 vessels with the following complications: None.    Anesthesia: None Episiotomy: None Lacerations: small abrasions Suture Repair: n/a Est. Blood Loss (mL): 50  Mom to postpartum.  Baby to Couplet care / Skin to Skin.  Hansel Feinstein 12/29/2020, 5:08 AM  Please schedule this patient for Postpartum visit in: 4 weeks with the following provider: Any provider In-Person For C/S patients schedule nurse incision check in weeks 2 weeks: no Low risk pregnancy complicated by: asymptomatic bacteruria Delivery mode:  SVD Anticipated Birth Control:  Nexplanon PP Procedures needed: Nexplanon  [place]: negative Schedule Integrated BH visit: no  No relevant baby issues

## 2020-11-22 ENCOUNTER — Encounter: Payer: Self-pay | Admitting: *Deleted

## 2020-11-22 ENCOUNTER — Encounter: Payer: Self-pay | Admitting: Medical

## 2020-11-22 ENCOUNTER — Telehealth (INDEPENDENT_AMBULATORY_CARE_PROVIDER_SITE_OTHER): Payer: Self-pay | Admitting: Medical

## 2020-11-22 VITALS — BP 119/69 | HR 98

## 2020-11-22 DIAGNOSIS — R12 Heartburn: Secondary | ICD-10-CM

## 2020-11-22 DIAGNOSIS — Z3A34 34 weeks gestation of pregnancy: Secondary | ICD-10-CM

## 2020-11-22 DIAGNOSIS — O26893 Other specified pregnancy related conditions, third trimester: Secondary | ICD-10-CM

## 2020-11-22 DIAGNOSIS — Z3481 Encounter for supervision of other normal pregnancy, first trimester: Secondary | ICD-10-CM

## 2020-11-22 NOTE — Patient Instructions (Signed)
Fetal Movement Counts Patient Name: ________________________________________________ Patient Due Date: ____________________ What is a fetal movement count?  A fetal movement count is the number of times that you feel your baby move during a certain amount of time. This may also be called a fetal kick count. A fetal movement count is recommended for every pregnant woman. You may be asked to start counting fetal movements as early as week 28 of your pregnancy. Pay attention to when your baby is most active. You may notice your baby's sleep and wake cycles. You may also notice things that make your baby move more. You should do a fetal movement count:  When your baby is normally most active.  At the same time each day. A good time to count movements is while you are resting, after having something to eat and drink. How do I count fetal movements? 1. Find a quiet, comfortable area. Sit, or lie down on your side. 2. Write down the date, the start time and stop time, and the number of movements that you felt between those two times. Take this information with you to your health care visits. 3. Write down your start time when you feel the first movement. 4. Count kicks, flutters, swishes, rolls, and jabs. You should feel at least 10 movements. 5. You may stop counting after you have felt 10 movements, or if you have been counting for 2 hours. Write down the stop time. 6. If you do not feel 10 movements in 2 hours, contact your health care provider for further instructions. Your health care provider may want to do additional tests to assess your baby's well-being. Contact a health care provider if:  You feel fewer than 10 movements in 2 hours.  Your baby is not moving like he or she usually does. Date: ____________ Start time: ____________ Stop time: ____________ Movements: ____________ Date: ____________ Start time: ____________ Stop time: ____________ Movements: ____________ Date: ____________  Start time: ____________ Stop time: ____________ Movements: ____________ Date: ____________ Start time: ____________ Stop time: ____________ Movements: ____________ Date: ____________ Start time: ____________ Stop time: ____________ Movements: ____________ Date: ____________ Start time: ____________ Stop time: ____________ Movements: ____________ Date: ____________ Start time: ____________ Stop time: ____________ Movements: ____________ Date: ____________ Start time: ____________ Stop time: ____________ Movements: ____________ Date: ____________ Start time: ____________ Stop time: ____________ Movements: ____________ This information is not intended to replace advice given to you by your health care provider. Make sure you discuss any questions you have with your health care provider. Document Revised: 06/24/2019 Document Reviewed: 06/24/2019 Elsevier Patient Education  2020 [employer] of the uterus can occur throughout pregnancy, but they are not always a sign that you are in labor. You may have practice contractions called Braxton Hicks contractions. These false labor contractions are sometimes confused with true labor. What are [place] contractions? Braxton Hicks contractions are tightening movements that occur in the muscles of the uterus before labor. Unlike true labor contractions, these contractions do not result in opening (dilation) and thinning of the cervix. Toward the end of pregnancy (32-34 weeks), Braxton Hicks contractions can happen more often and may become stronger. These contractions are sometimes difficult to tell apart from true labor because they can be very uncomfortable. You should not feel embarrassed if you go to the hospital with false labor. Sometimes, the only way to tell if you are in true labor is for your health care provider to look for changes in the cervix. The health care provider  will do a physical exam and may  monitor your contractions. If you are not in true labor, the exam should show that your cervix is not dilating and your water has not broken. If there are no other health problems associated with your pregnancy, it is completely safe for you to be sent home with false labor. You may continue to have Braxton Hicks contractions until you go into true labor. How to tell the difference between true labor and false labor True labor  Contractions last 30-70 seconds.  Contractions become very regular.  Discomfort is usually felt in the top of the uterus, and it spreads to the lower abdomen and low back.  Contractions do not go away with walking.  Contractions usually become more intense and increase in frequency.  The cervix dilates and gets thinner. False labor  Contractions are usually shorter and not as strong as true labor contractions.  Contractions are usually irregular.  Contractions are often felt in the front of the lower abdomen and in the groin.  Contractions may go away when you walk around or change positions while lying down.  Contractions get weaker and are shorter-lasting as time goes on.  The cervix usually does not dilate or become thin. Follow these instructions at home:   Take over-the-counter and prescription medicines only as told by your health care provider.  Keep up with your usual exercises and follow other instructions from your health care provider.  Eat and drink lightly if you think you are going into labor.  If Braxton Hicks contractions are making you uncomfortable: ? Change your position from lying down or resting to walking, or change from walking to resting. ? Sit and rest in a tub of warm water. ? Drink enough fluid to keep your urine pale yellow. Dehydration may cause these contractions. ? Do slow and deep breathing several times an hour.  Keep all follow-up prenatal visits as told by your health care provider. This is important. Contact a  health care provider if:  You have a fever.  You have continuous pain in your abdomen. Get help right away if:  Your contractions become stronger, more regular, and closer together.  You have fluid leaking or gushing from your vagina.  You pass blood-tinged mucus (bloody show).  You have bleeding from your vagina.  You have low back pain that you never had before.  You feel your baby's head pushing down and causing pelvic pressure.  Your baby is not moving inside you as much as it used to. Summary  Contractions that occur before labor are called Braxton Hicks contractions, false labor, or practice contractions.  Braxton Hicks contractions are usually shorter, weaker, farther apart, and less regular than true labor contractions. True labor contractions usually become progressively stronger and regular, and they become more frequent.  Manage discomfort from Braxton Hicks contractions by changing position, resting in a warm bath, drinking plenty of water, or practicing deep breathing. This information is not intended to replace advice given to you by your health care provider. Make sure you discuss any questions you have with your health care provider. Document Revised: 10/17/2017 Document Reviewed: 03/20/2017 Elsevier Patient Education  2020 Elsevier Inc.  

## 2020-11-22 NOTE — Progress Notes (Signed)
I connected with Kaleiyah Polsky 11/22/20 at 11:30 AM EST by: MyChart video and verified that I am speaking with the correct person using two identifiers.  Patient is located at home and provider is located at [facility].     The purpose of this virtual visit is to provide medical care while limiting exposure to the novel coronavirus. I discussed the limitations, risks, security and privacy concerns of performing an evaluation and management service by MyChart video and the availability of in person appointments. I also discussed with the patient that there may be a patient responsible charge related to this service. By engaging in this virtual visit, you consent to the provision of healthcare.  Additionally, you authorize for your insurance to be billed for the services provided during this visit.  The patient expressed understanding and agreed to proceed.  The following staff members participated in the virtual visit:  Faith Rogue, LPN    PRENATAL VISIT NOTE  Subjective:  Poet Hineman is a 25 y.o. U2V2536 at [redacted]w[redacted]d  for phone visit for ongoing prenatal care.  She is currently monitored for the following issues for this low-risk pregnancy and has Asymptomatic bacteriuria during pregnancy in second trimester and Encounter for supervision of normal pregnancy in multigravida in first trimester on their problem list.  Patient reports heartburn and occasional contractions.  Contractions: Irritability. Vag. Bleeding: None.  Movement: Present. Denies leaking of fluid.   The following portions of the patient's history were reviewed and updated as appropriate: allergies, current medications, past family history, past medical history, past social history, past surgical history and problem list.   Objective:   Vitals:   11/22/20 1134 11/22/20 1159  BP: 140/60 119/69  Pulse: 84 98   Self-Obtained  Fetal Status:     Movement: Present     Assessment and Plan:  Pregnancy: [id number] at [redacted]w[redacted]d 1.  Encounter for supervision of normal pregnancy in multigravida in first trimester - Doing well - Occasional BH contractions and cramping, denies bleeding - Anticipatory guidance for upcoming visits discussed including GBS and GC/Chlamydia at next visit  2. [redacted] weeks gestation of pregnancy  3. Heartburn in pregnancy in third trimester - TUMs with meals advised to start, if no relief will consider Rx preventative  Preterm labor symptoms and general obstetric precautions including but not limited to vaginal bleeding, contractions, leaking of fluid and fetal movement were reviewed in detail with the patient.  Return in about 2 weeks (around 12/06/2020) for LOB, In-Person.  No future appointments.   Time spent on virtual visit: 11 minutes  Vonzella Nipple, PA-C

## 2020-12-06 ENCOUNTER — Encounter: Payer: Self-pay | Admitting: Obstetrics and Gynecology

## 2020-12-06 ENCOUNTER — Other Ambulatory Visit: Payer: Self-pay

## 2020-12-06 ENCOUNTER — Other Ambulatory Visit (HOSPITAL_COMMUNITY)
Admission: RE | Admit: 2020-12-06 | Discharge: 2020-12-06 | Disposition: A | Discharge status: Home / Self Care | Payer: Self-pay | Source: Ambulatory Visit | Attending: Obstetrics and Gynecology | Admitting: Obstetrics and Gynecology

## 2020-12-06 ENCOUNTER — Ambulatory Visit (INDEPENDENT_AMBULATORY_CARE_PROVIDER_SITE_OTHER): Payer: Self-pay | Admitting: Obstetrics and Gynecology

## 2020-12-06 VITALS — BP 113/59 | HR 93 | Wt 208.4 lb

## 2020-12-06 DIAGNOSIS — Z3A36 36 weeks gestation of pregnancy: Secondary | ICD-10-CM | POA: Insufficient documentation

## 2020-12-06 DIAGNOSIS — Z3481 Encounter for supervision of other normal pregnancy, first trimester: Secondary | ICD-10-CM

## 2020-12-06 DIAGNOSIS — Z3483 Encounter for supervision of other normal pregnancy, third trimester: Secondary | ICD-10-CM | POA: Insufficient documentation

## 2020-12-06 NOTE — Progress Notes (Signed)
Subjective:  Ashlee Vincent is a 25 y.o. [phone] at [redacted]w[redacted]d being seen today for ongoing prenatal care.  She is currently monitored for the following issues for this low-risk pregnancy and has Asymptomatic bacteriuria during pregnancy in second trimester and Encounter for supervision of normal pregnancy in multigravida in first trimester on their problem list.  Patient reports general discomforts of pregnancy.  Contractions: Irritability. Vag. Bleeding: None.  Movement: Present. Denies leaking of fluid.   The following portions of the patient's history were reviewed and updated as appropriate: allergies, current medications, past family history, past medical history, past social history, past surgical history and problem list. Problem list updated.  Objective:   Vitals:   12/06/20 1400  BP: (!) 113/59  Pulse: 93  Weight: 208 lb 6.4 oz (94.5 kg)    Fetal Status:     Movement: Present     General:  Alert, oriented and cooperative. Patient is in no acute distress.  Skin: Skin is warm and dry. No rash noted.   Cardiovascular: Normal heart rate noted  Respiratory: Normal respiratory effort, no problems with respiration noted  Abdomen: Soft, gravid, appropriate for gestational age. Pain/Pressure: Present     Pelvic:  Cervical exam performed        Extremities: Normal range of motion.  Edema: Trace  Mental Status: Normal mood and affect. Normal behavior. Normal judgment and thought content.   Urinalysis:      Assessment and Plan:  Pregnancy: [id number] at [redacted]w[redacted]d  1. Encounter for supervision of normal pregnancy in multigravida in first trimester Stable GBS obtained today Labor precautions  Preterm labor symptoms and general obstetric precautions including but not limited to vaginal bleeding, contractions, leaking of fluid and fetal movement were reviewed in detail with the patient. Please refer to After Visit Summary for other counseling recommendations.  Return in about 1 week (around  12/13/2020) for OB visit, face to face, any provider, virtual.   Chancy Milroy, MD

## 2020-12-06 NOTE — Patient Instructions (Signed)

## 2020-12-06 NOTE — Addendum Note (Signed)
Addended by: Dorita Sciara, Norrin Shreffler A on: 12/06/2020 02:21 PM   Modules accepted: Orders

## 2020-12-08 LAB — CERVICOVAGINAL ANCILLARY ONLY
Chlamydia: NEGATIVE
Comment: NEGATIVE
Comment: NORMAL
Neisseria Gonorrhea: NEGATIVE

## 2020-12-10 LAB — CULTURE, BETA STREP (GROUP B ONLY): Strep Gp B Culture: NEGATIVE

## 2020-12-18 ENCOUNTER — Telehealth (INDEPENDENT_AMBULATORY_CARE_PROVIDER_SITE_OTHER): Payer: Self-pay | Admitting: Women's Health

## 2020-12-18 ENCOUNTER — Encounter: Payer: Self-pay | Admitting: Women's Health

## 2020-12-18 VITALS — BP 110/82 | HR 87

## 2020-12-18 DIAGNOSIS — O26893 Other specified pregnancy related conditions, third trimester: Secondary | ICD-10-CM

## 2020-12-18 DIAGNOSIS — N898 Other specified noninflammatory disorders of vagina: Secondary | ICD-10-CM

## 2020-12-18 DIAGNOSIS — Z3A38 38 weeks gestation of pregnancy: Secondary | ICD-10-CM

## 2020-12-18 DIAGNOSIS — Z3483 Encounter for supervision of other normal pregnancy, third trimester: Secondary | ICD-10-CM

## 2020-12-18 NOTE — Progress Notes (Signed)
TELEHEALTH VIRTUAL OBSTETRICS VISIT ENCOUNTER NOTE Patient name: Ashlee Vincent MRN [id number]  Date of birth: 1996/03/12  I connected with patient on 12/18/20 at  9:10 AM EST by MyChart video  and verified that I am speaking with the correct person using two identifiers. Pt is not currently in our office, she is at home.  The provider is in the office.    I discussed the limitations, risks, security and privacy concerns of performing an evaluation and management service by telephone and the availability of in person appointments. I also discussed with the patient that there may be a patient responsible charge related to this service. The patient expressed understanding and agreed to proceed.  Chief Complaint:   Routine Prenatal Visit  History of Present Illness:   Ashlee Vincent is a 25 y.o. [phone] female at [redacted]w[redacted]d with an Estimated Date of Delivery: 01/01/21 being evaluated today for ongoing management of a low-risk pregnancy.  Depression screen [facility] 2/9 06/22/2020 12/08/2017  Decreased Interest 0 0  Down, Depressed, Hopeless 0 0  PHQ - 2 Score 0 0  Altered sleeping 1 0  Tired, decreased energy 1 0  Change in appetite 0 0  Feeling bad or failure about yourself  0 0  Trouble concentrating 0 0  Moving slowly or fidgety/restless 0 0  Suicidal thoughts 0 0  PHQ-9 Score 2 0    Today she reports white watery d/c, no itching/odor/irritation. No big gush or leaking down legs. Contractions: Irritability. Vag. Bleeding: None.  Movement: Present. denies leaking of fluid. Review of Systems:   Pertinent items are noted in HPI Denies abnormal vaginal discharge w/ itching/odor/irritation, headaches, visual changes, shortness of breath, chest pain, abdominal pain, severe nausea/vomiting, or problems with urination or bowel movements unless otherwise stated above. Pertinent History Reviewed:  Reviewed past medical,surgical, social, obstetrical and family history.  Reviewed problem list, medications  and allergies. Physical Assessment:   Vitals:   12/18/20 0909  BP: 110/82  Pulse: 87  There is no height or weight on file to calculate BMI.        Physical Examination:   General:  Alert, oriented and cooperative.   Mental Status: Normal mood and affect perceived. Normal judgment and thought content.  Rest of physical exam deferred due to type of encounter  No results found for this or any previous visit (from the past 24 hour(s)).  Assessment & Plan:  1) Pregnancy [id number] at [redacted]w[redacted]d with an Estimated Date of Delivery: 01/01/21   2) White watery d/c, doesn't sound like ROM, asymptomatic. If still present next week will evaluate then   Meds: No orders of the defined types were placed in this encounter.   Labs/procedures today: none  Plan:  Continue routine obstetrical care .  Has home bp cuff.  Check bp weekly, let us know if >140/90.  Next visit: prefers in person    Reviewed: Term labor symptoms and general obstetric precautions including but not limited to vaginal bleeding, contractions, leaking of fluid and fetal movement were reviewed in detail with the patient. The patient was advised to call back or seek an in-person office evaluation/go to MAU at [facility] for any urgent or concerning symptoms. All questions were answered. Please refer to After Visit Summary for other counseling recommendations.    I provided 15 minutes of non-face-to-face time during this encounter.  Follow-up: Return in about 1 week (around 12/25/2020) for LROB, CNM, in person.  No orders of the defined types were placed  in this encounter.  [place], [facility] 12/18/2020 9:21 AM

## 2020-12-18 NOTE — Patient Instructions (Signed)
Cheri Kearns, I greatly value your feedback.  If you receive a survey following your visit with [place] today, we appreciate you taking the time to fill it out.  Thanks, [name], CNM, WHNP-BC  [facility] ([place], [place] [address]) Entrance C, located off of [address] parking   Go to [email or web address] to register for FREE online childbirth classes    Call the office [phone]) or go to [facility] if:  You begin to have strong, frequent contractions  Your water breaks.  Sometimes it is a big gush of fluid, sometimes it is just a trickle that keeps getting your panties wet or running down your legs  You have vaginal bleeding.  It is normal to have a small amount of spotting if your cervix was checked.   You don't feel your baby moving like normal.  If you don't, get you something to eat and drink and lay down and focus on feeling your baby move.  You should feel at least 10 movements in 2 hours.  If you don't, you should call the office or go to [facility].   Call the office [phone]) or go to [facility] hospital for these signs of pre-eclampsia:  Severe headache that does not go away with Tylenol  Visual changes- seeing spots, double, blurred vision  Pain under your right breast or upper abdomen that does not go away with Tums or heartburn medicine  Nausea and/or vomiting  Severe swelling in your hands, feet, and face    Home Blood Pressure Monitoring for Patients   Your provider has recommended that you check your blood pressure (BP) at least once a week at home. If you do not have a blood pressure cuff at home, one will be provided for you. Contact your provider if you have not received your monitor within 1 week.   Helpful Tips for Accurate Home Blood Pressure Checks  . Don't smoke, exercise, or drink caffeine 30 minutes before checking your BP . Use the restroom before checking your BP (a full  bladder can raise your pressure) . Relax in a comfortable upright chair . Feet on the ground . Left arm resting comfortably on a flat surface at the level of your heart . Legs uncrossed . Back supported . Sit quietly and don't talk . Place the cuff on your bare arm . Adjust snuggly, so that only two fingertips can fit between your skin and the top of the cuff . Check 2 readings separated by at least one minute . Keep a log of your BP readings . For a visual, please reference this diagram: http://ccnc.care/bpdiagram  Provider Name: [facility]     Phone: [phone]  Zone 1: ALL CLEAR  Continue to monitor your symptoms:  . BP reading is less than 140 (top number) or less than 90 (bottom number)  . No right upper stomach pain . No headaches or seeing spots . No feeling nauseated or throwing up . No swelling in face and hands  Zone 2: CAUTION Call your doctor's office for any of the following:  . BP reading is greater than 140 (top number) or greater than 90 (bottom number)  . Stomach pain under your ribs in the middle or right side . Headaches or seeing spots . Feeling nauseated or throwing up . Swelling in face and hands  Zone 3: EMERGENCY  Seek immediate medical care if you have any of the  following:  . BP reading is greater than160 (top number) or greater than 110 (bottom number) . Severe headaches not improving with Tylenol . Serious difficulty catching your breath . Any worsening symptoms from Zone 2   Braxton Hicks Contractions Contractions of the uterus can occur throughout pregnancy, but they are not always a sign that you are in labor. You may have practice contractions called Braxton Hicks contractions. These false labor contractions are sometimes confused with true labor. What are [place] contractions? Braxton Hicks contractions are tightening movements that occur in the muscles of the uterus before labor. Unlike true labor contractions, these  contractions do not result in opening (dilation) and thinning of the cervix. Toward the end of pregnancy (32-34 weeks), Braxton Hicks contractions can happen more often and may become stronger. These contractions are sometimes difficult to tell apart from true labor because they can be very uncomfortable. You should not feel embarrassed if you go to the hospital with false labor. Sometimes, the only way to tell if you are in true labor is for your health care provider to look for changes in the cervix. The health care provider will do a physical exam and may monitor your contractions. If you are not in true labor, the exam should show that your cervix is not dilating and your water has not broken. If there are no other health problems associated with your pregnancy, it is completely safe for you to be sent home with false labor. You may continue to have Braxton Hicks contractions until you go into true labor. How to tell the difference between true labor and false labor True labor  Contractions last 30-70 seconds.  Contractions become very regular.  Discomfort is usually felt in the top of the uterus, and it spreads to the lower abdomen and low back.  Contractions do not go away with walking.  Contractions usually become more intense and increase in frequency.  The cervix dilates and gets thinner. False labor  Contractions are usually shorter and not as strong as true labor contractions.  Contractions are usually irregular.  Contractions are often felt in the front of the lower abdomen and in the groin.  Contractions may go away when you walk around or change positions while lying down.  Contractions get weaker and are shorter-lasting as time goes on.  The cervix usually does not dilate or become thin. Follow these instructions at home:  1. Take over-the-counter and prescription medicines only as told by your health care provider. 2. Keep up with your usual exercises and follow other  instructions from your health care provider. 3. Eat and drink lightly if you think you are going into labor. 4. If Braxton Hicks contractions are making you uncomfortable: ? Change your position from lying down or resting to walking, or change from walking to resting. ? Sit and rest in a tub of warm water. ? Drink enough fluid to keep your urine pale yellow. Dehydration may cause these contractions. ? Do slow and deep breathing several times an hour. 5. Keep all follow-up prenatal visits as told by your health care provider. This is important. Contact a health care provider if:  You have a fever.  You have continuous pain in your abdomen. Get help right away if:  Your contractions become stronger, more regular, and closer together.  You have fluid leaking or gushing from your vagina.  You pass blood-tinged mucus (bloody show).  You have bleeding from your vagina.  You have low back pain  that you never had before.  You feel your baby's head pushing down and causing pelvic pressure.  Your baby is not moving inside you as much as it used to. Summary  Contractions that occur before labor are called Braxton Hicks contractions, false labor, or practice contractions.  Braxton Hicks contractions are usually shorter, weaker, farther apart, and less regular than true labor contractions. True labor contractions usually become progressively stronger and regular, and they become more frequent.  Manage discomfort from [facility] contractions by changing position, resting in a warm bath, drinking plenty of water, or practicing deep breathing. This information is not intended to replace advice given to you by your health care provider. Make sure you discuss any questions you have with your health care provider. Document Revised: 10/17/2017 Document Reviewed: 03/20/2017 Elsevier Patient Education  [place].

## 2020-12-25 ENCOUNTER — Encounter: Payer: Self-pay | Admitting: Obstetrics & Gynecology

## 2020-12-25 ENCOUNTER — Other Ambulatory Visit: Payer: Self-pay

## 2020-12-25 ENCOUNTER — Ambulatory Visit (INDEPENDENT_AMBULATORY_CARE_PROVIDER_SITE_OTHER): Payer: Self-pay | Admitting: Obstetrics & Gynecology

## 2020-12-25 VITALS — BP 121/72 | HR 88 | Wt 211.5 lb

## 2020-12-25 DIAGNOSIS — Z3483 Encounter for supervision of other normal pregnancy, third trimester: Secondary | ICD-10-CM

## 2020-12-25 DIAGNOSIS — Z23 Encounter for immunization: Secondary | ICD-10-CM

## 2020-12-25 NOTE — Progress Notes (Signed)
   LOW-RISK PREGNANCY VISIT Patient name: Ashlee Vincent MRN [id number]  Date of birth: 1996/08/04 Chief Complaint:   Routine Prenatal Visit  History of Present Illness:   Ashlee Vincent is a 25 y.o. [id number] female at [redacted]w[redacted]d with an Estimated Date of Delivery: 01/01/21 being seen today for ongoing management of a low-risk pregnancy.  Depression screen [facility] 2/9 06/22/2020 12/08/2017  Decreased Interest 0 0  Down, Depressed, Hopeless 0 0  PHQ - 2 Score 0 0  Altered sleeping 1 0  Tired, decreased energy 1 0  Change in appetite 0 0  Feeling bad or failure about yourself  0 0  Trouble concentrating 0 0  Moving slowly or fidgety/restless 0 0  Suicidal thoughts 0 0  PHQ-9 Score 2 0    Today she reports no complaints. Contractions: Irritability. Vag. Bleeding: None.  Movement: Present. denies leaking of fluid. Review of Systems:   Pertinent items are noted in HPI Denies abnormal vaginal discharge w/ itching/odor/irritation, headaches, visual changes, shortness of breath, chest pain, abdominal pain, severe nausea/vomiting, or problems with urination or bowel movements unless otherwise stated above. Pertinent History Reviewed:  Reviewed past medical,surgical, social, obstetrical and family history.  Reviewed problem list, medications and allergies. Physical Assessment:   Vitals:   12/25/20 1557  BP: 121/72  Pulse: 88  Weight: 211 lb 8 oz (95.9 kg)  Body mass index is 42.72 kg/m.        Physical Examination:   General appearance: Well appearing, and in no distress  Mental status: Alert, oriented to person, place, and time  Skin: Warm & dry  Cardiovascular: Normal heart rate noted  Respiratory: Normal respiratory effort, no distress  Abdomen: Soft, gravid, nontender  Pelvic: Cervical exam deferred  Dilation: Fingertip Effacement (%): Thick Station: -3  Extremities: Edema: Trace  Fetal Status: Fetal Heart Rate (bpm): 150 Fundal Height: 38 cm Movement: Present Presentation:  Vertex  Chaperone: n/a    No results found for this or any previous visit (from the past 24 hour(s)).  Assessment & Plan:  1) Low-risk pregnancy [id number] at [redacted]w[redacted]d with an Estimated Date of Delivery: 01/01/21   2)    Meds: No orders of the defined types were placed in this encounter.  Labs/procedures today:   Plan:  Continue routine obstetrical care  Next visit: prefers in person    Reviewed: Preterm labor symptoms and general obstetric precautions including but not limited to vaginal bleeding, contractions, leaking of fluid and fetal movement were reviewed in detail with the patient.  All questions were answered. has home bp cuff. Rx faxed to . Check bp weekly, let us know if >140/90.   Follow-up: Return in about 1 week (around 01/01/2021) for LROB, NST.  Orders Placed This Encounter  Procedures  . Tdap vaccine greater than or equal to 7yo IM    Florian Buff, MD 12/25/2020 4:45 PM

## 2020-12-28 ENCOUNTER — Other Ambulatory Visit: Payer: Self-pay

## 2020-12-28 ENCOUNTER — Inpatient Hospital Stay (HOSPITAL_COMMUNITY)
Admission: AD | Admit: 2020-12-28 | Discharge: 2020-12-28 | Disposition: A | Discharge status: Home / Self Care | Payer: Self-pay | Attending: Family Medicine | Admitting: Family Medicine

## 2020-12-28 ENCOUNTER — Encounter (HOSPITAL_COMMUNITY): Payer: Self-pay | Admitting: Family Medicine

## 2020-12-28 DIAGNOSIS — Z3A39 39 weeks gestation of pregnancy: Secondary | ICD-10-CM | POA: Insufficient documentation

## 2020-12-28 DIAGNOSIS — Z3481 Encounter for supervision of other normal pregnancy, first trimester: Secondary | ICD-10-CM

## 2020-12-28 DIAGNOSIS — O471 False labor at or after 37 completed weeks of gestation: Secondary | ICD-10-CM | POA: Insufficient documentation

## 2020-12-28 DIAGNOSIS — O479 False labor, unspecified: Secondary | ICD-10-CM

## 2020-12-28 NOTE — MAU Note (Signed)
I have communicated with Dr. Astrid Drafts and reviewed vital signs:  Vitals:   12/28/20 1138 12/28/20 1325  BP: (!) 112/59 123/79  Pulse: 87 81  Resp: 18 20  Temp:  98.5 F (36.9 C)  SpO2: 99% 100%    Vaginal exam:  Dilation: 4 Effacement (%): 70 Station: -2 Presentation: Vertex Exam by:: F. Jocelyn Lowery, RNC,   Also reviewed contraction pattern and that non-stress test is reactive.  It has been documented that patient is contracting every 6-67minutes with minimal cervical change over 1.5 hours not indicating active labor.  Patient denies any other complaints.  Based on this report provider has given order for discharge.  A discharge order and diagnosis entered by a provider.   Labor discharge instructions reviewed with patient.

## 2020-12-28 NOTE — MAU Note (Signed)
Ctx on and off since yesterday. Stronger and closer together  q6 min. Reports some bloody show yesterday(lost mucus plug)Denies any leaking at this time. Good fetal movement felt.

## 2020-12-28 NOTE — MAU Note (Cosign Needed Addendum)
S: Ms. Yaffa Seckman is a 25 y.o. [phone] at [redacted]w[redacted]d  who presents to MAU today for labor evaluation.     Cervical exam by RN:  Dilation: 4 Effacement (%): 70 Station: -2 Presentation: Vertex Exam by:: F. Morris, RNC  Fetal Monitoring: Baseline: 135 Variability: Moderate Accelerations: Present Decelerations: Absent Contractions: q8-9  MDM Discussed patient with RN. NST reviewed.   A: SIUP at [redacted]w[redacted]d  False labor  P: Discharge home Labor precautions and kick counts included in AVS Patient to follow-up with Dr. Elonda Husky as scheduled on 2/14 Patient may return to MAU as needed or when in labor   Lenna Sciara, MD 12/28/2020 1:21 PM  I also reviewed NST and agree with resident's plan as noted above.  Randa Ngo, MD OB Fellow, [facility] 12/28/2020 8:41 PM

## 2020-12-28 NOTE — Discharge Instructions (Signed)
Evaluacin de los movimientos fetales Fetal Movement Counts Nombre del paciente: ________________________________________________ Ashlee Vincent estimada: ____________________  Ashlee Vincent evaluacin de los movimientos fetales? Una evaluacin de los movimientos fetales es el registro del nmero de veces que siente que el beb se mueve durante un cierto perodo de [place]. Esto tambin se puede denominar recuento de patadas fetales. Una evaluacin de movimientos fetales se recomienda a todas las embarazadas. Es posible que le indiquen que comience a Chief of Staff los movimientos fetales desde la semana 28 de [place]. Preste atencin cuando sienta que el beb est ms activo. Podr detectar los ciclos en que el beb duerme y est despierto. Tambin podr detectar que ciertas cosas hacen que su beb se mueva ms. Deber realizar una evaluacin de los movimientos fetales en las siguientes situaciones:  Cuando el beb est ms activo habitualmente.  A la [employer], todos los [place]. Un buen momento para evaluar los movimientos fetales es cuando est descansando, despus de haber comido y bebido algo. Cmo debo contar los movimientos fetales? 1. Encuentre un lugar tranquilo y cmodo. Sintese o acustese de lado. 2. Anote la fecha, la hora de inicio y de finalizacin y la cantidad de movimientos que sinti entre esas dos horas. Lleve esta informacin a las visitas de control. 3. Anote la hora de inicio cuando Scientific laboratory technician. 4. Cuente las pataditas, revoloteos, chasquidos, vueltas o pinchazos. Debe sentir al menos 68movimientos. 5. Puede dejar de contar despus de haber sentido 10 movimientos o de haber contado [employer]. Anote la hora de finalizacin. 6. Si no siente 36movimientos en 2horas, comunquese con su mdico para obtener ms indicaciones. Es posible que el mdico quiera realizar estudios adicionales para TEFL teacher del beb. Comunquese con un mdico si:  Siente  menos de 72movimientos en 2horas.  El beb no se mueve tanto como suele hacerlo. Fecha: ____________ Ashlee Vincent inicio: ____________ Ashlee Vincent finalizacin: ____________ Movimientos: ____________ Ashlee Vincent: ____________ Ashlee Vincent inicio: ____________ Ashlee Vincent finalizacin: ____________ Movimientos: ____________ Ashlee Vincent: ____________ Ashlee Vincent inicio: ____________ Ashlee Vincent finalizacin: ____________ Movimientos: ____________ Ashlee Vincent: ____________ Ashlee Vincent inicio: ____________ Ashlee Vincent finalizacin: ____________ Movimientos: ____________ Ashlee Vincent: ____________ Ashlee Vincent inicio: ____________ Ashlee Vincent de finalizacin: ____________ Movimientos: ____________ Ashlee Vincent: ____________ Ashlee Vincent inicio: ____________ Ashlee Vincent de finalizacin: ____________ Movimientos: ____________ Ashlee Vincent: ____________ Ashlee Vincent inicio: ____________ Ashlee Vincent de finalizacin: ____________ Movimientos: ____________ Ashlee Vincent: ____________ Ashlee Vincent inicio: ____________ Ashlee Vincent finalizacin: ____________ Movimientos: ____________ Ashlee Vincent: ____________ Ashlee Vincent inicio: ____________ Ashlee Vincent de finalizacin: ____________ Movimientos: ____________ Esta informacin no tiene como fin reemplazar el consejo del mdico. Asegrese de hacerle al mdico cualquier pregunta que tenga. Document Revised: 08/31/2019 Document Reviewed: 08/31/2019 Elsevier Patient Education  [place]. Rosen's Emergency Medicine: Concepts and Clinical Practice (9th ed., pp. 2296- 2312). Elsevier.">  Contracciones de [employer]xton [employer] Las contracciones del tero pueden presentarse durante todo el [place], [place] no siempre indican que la mujer est de [place]. Es posible que usted haya tenido contracciones de prctica llamadas "contracciones de [place]". A veces, se las confunde con el parto real. Qu son las contracciones de [place]? Las contracciones de [place] son espasmos que se producen en los msculos del tero antes del [place]. A diferencia de las contracciones del  parto verdadero, estas no producen el agrandamiento (la dilatacin) ni el afinamiento del cuello uterino. Hacia el final del embarazo [facility] las semanas [phone]), las contracciones de Braxton Hicks pueden presentarse ms seguido y tornarse ms intensas. A veces, resulta difcil distinguirlas del  parto verdadero porque pueden ser [employer]. No debe sentirse avergonzada si concurre al hospital con falso parto. En ocasiones, la nica forma de saber si el trabajo de parto es verdadero es que el mdico determine si hay cambios en el cuello del tero. El [employer] har un examen fsico y Armed forces operational officer controle las contracciones. Si usted no est de Network engineer, el examen debe indicar que el cuello uterino no est dilatado y que usted no ha roto Financial trader. Si no hay otros problemas de salud asociados con su embarazo, no habr inconvenientes si la envan a su casa con un falso parto. Es posible que las contracciones de Braxton Hicks continen hasta que se desencadene el parto verdadero. Cmo diferenciar el [place] de parto falso del verdadero [place] de parto verdadero  Las contracciones [place].  Las contracciones pueden tornarse muy regulares.  La molestia generalmente se siente en la parte superior del tero y se extiende hacia la zona baja del abdomen y [employer] cintura.  Las contracciones no desaparecen cuando usted camina.  Las contracciones generalmente se hacen ms intensas y Copywriter, advertising.  El cuello uterino se dilata y se afina. Parto falso  En general, las contracciones son ms cortas y no tan intensas como las del parto verdadero.  En general, las contracciones son irregulares.  A menudo, las contracciones se sienten en la parte delantera de la parte baja del abdomen y en la ingle.  Las Physiological scientist cuando usted camina o [place] de posicin mientras est [place].  Las contracciones se vuelven ms dbiles y su duracin es menor a medida que  transcurre [employer].  En general, el cuello uterino no se dilata ni se afina. Siga estas indicaciones en su casa:  Tome los medicamentos de venta libre y los recetados solamente como se lo haya indicado el mdico.  Contine haciendo los ejercicios habituales y siga las dems indicaciones que el mdico le d.  Coma y beba con moderacin si cree que est de parto.  Si las contracciones de [employer] provocan incomodidad: ? Cambie de posicin: si est acostada o descansando, camine; si est caminando, descanse. ? Sintese y descanse en una baera con agua tibia. ? Beba suficiente lquido como para mantener la orina de color amarillo plido. La deshidratacin puede provocar contracciones. ? Respire lenta y profundamente varias veces por hora.  Vaya a todas las visitas de control prenatales y de control como se lo haya indicado el mdico. Esto es importante.   Comunquese con un mdico si:  Tiene fiebre.  Siente dolor constante en el abdomen. Solicite ayuda de inmediato si:  Las contracciones se intensifican, se hacen ms regulares y Industrial/product designer s.  Tiene una prdida de lquido por la vagina.  Elimina una mucosidad sanguinolenta (prdida del tapn mucoso).  Tiene una hemorragia vaginal.  Tiene un dolor en la zona lumbar que nunca tuvo antes.  Siente que la cabeza del beb empuja hacia abajo y ejerce presin en la zona plvica.  El beb no se mueve tanto como antes. Resumen  Las [employer] se presentan antes del parto se conocen como contracciones de [place] parto o contracciones de Location manager.  En general, las contracciones de Braxton Hicks son ms cortas, ms dbiles, con ms tiempo entre una y [place], y menos regulares que las contracciones del parto verdadero. Las contracciones del parto verdadero se intensifican progresivamente y se tornan regulares y ms frecuentes.  Para controlar la [employer]  producen las contracciones de [place],  puede cambiar de posicin, darse un bao templado y Production assistant, radio, beber mucha agua o practicar la respiracin profunda. Esta informacin no tiene Marine scientist el consejo del mdico. Asegrese de hacerle al mdico cualquier pregunta que tenga. Document Revised: 02/13/2018 Document Reviewed: 06/16/2017 Elsevier Patient Education  [place].

## 2020-12-29 ENCOUNTER — Inpatient Hospital Stay (HOSPITAL_COMMUNITY)
Admission: AD | Admit: 2020-12-29 | Discharge: 2020-12-30 | DRG: 807 | Disposition: A | Discharge status: Home / Self Care | Payer: Medicaid Other | Attending: Obstetrics and Gynecology | Admitting: Obstetrics and Gynecology

## 2020-12-29 ENCOUNTER — Encounter (HOSPITAL_COMMUNITY): Payer: Self-pay | Admitting: Obstetrics and Gynecology

## 2020-12-29 ENCOUNTER — Other Ambulatory Visit: Payer: Self-pay

## 2020-12-29 DIAGNOSIS — Z3A39 39 weeks gestation of pregnancy: Secondary | ICD-10-CM | POA: Diagnosis not present

## 2020-12-29 DIAGNOSIS — O26893 Other specified pregnancy related conditions, third trimester: Secondary | ICD-10-CM | POA: Diagnosis present

## 2020-12-29 DIAGNOSIS — Z20822 Contact with and (suspected) exposure to covid-19: Secondary | ICD-10-CM | POA: Diagnosis present

## 2020-12-29 DIAGNOSIS — O479 False labor, unspecified: Secondary | ICD-10-CM | POA: Diagnosis present

## 2020-12-29 DIAGNOSIS — O4202 Full-term premature rupture of membranes, onset of labor within 24 hours of rupture: Secondary | ICD-10-CM

## 2020-12-29 LAB — RPR: RPR Ser Ql: NONREACTIVE

## 2020-12-29 LAB — CBC
HCT: 41.6 % (ref 36.0–46.0)
Hemoglobin: 12.8 g/dL (ref 12.0–15.0)
MCH: 28.7 pg (ref 26.0–34.0)
MCHC: 30.8 g/dL (ref 30.0–36.0)
MCV: 93.3 fL (ref 80.0–100.0)
Platelets: 238 10*3/uL (ref 150–400)
RBC: 4.46 MIL/uL (ref 3.87–5.11)
RDW: 14.3 % (ref 11.5–15.5)
WBC: 9.1 10*3/uL (ref 4.0–10.5)
nRBC: 0 % (ref 0.0–0.2)

## 2020-12-29 LAB — RESP PANEL BY RT-PCR (FLU A&B, COVID) ARPGX2
Influenza A by PCR: NEGATIVE
Influenza B by PCR: NEGATIVE
SARS Coronavirus 2 by RT PCR: NEGATIVE

## 2020-12-29 LAB — TYPE AND SCREEN
ABO/RH(D): O POS
Antibody Screen: NEGATIVE

## 2020-12-29 MED ORDER — FENTANYL CITRATE (PF) 100 MCG/2ML IJ SOLN
100.0000 ug | INTRAMUSCULAR | Status: DC | PRN
Start: 2020-12-29 — End: 2020-12-29
  Administered 2020-12-29 (×2): 100 ug via INTRAVENOUS
  Filled 2020-12-29 (×3): qty 2

## 2020-12-29 MED ORDER — DIPHENHYDRAMINE HCL 25 MG PO CAPS: 25.0000 mg | ORAL_CAPSULE | Freq: Four times a day (QID) | ORAL | Status: DC | PRN

## 2020-12-29 MED ORDER — ACETAMINOPHEN 325 MG PO TABS
650.0000 mg | ORAL_TABLET | ORAL | Status: DC | PRN
  Administered 2020-12-29: 650 mg via ORAL
  Filled 2020-12-29: qty 2

## 2020-12-29 MED ORDER — FENTANYL CITRATE (PF) 100 MCG/2ML IJ SOLN
INTRAMUSCULAR | Status: AC
  Administered 2020-12-29: 100 ug via INTRAVENOUS
  Filled 2020-12-29: qty 2

## 2020-12-29 MED ORDER — WITCH HAZEL-GLYCERIN EX PADS: 1.0000 "application " | MEDICATED_PAD | CUTANEOUS | Status: DC | PRN

## 2020-12-29 MED ORDER — LACTATED RINGERS IV SOLN: 500.0000 mL | INTRAVENOUS | Status: DC | PRN

## 2020-12-29 MED ORDER — OXYCODONE-ACETAMINOPHEN 5-325 MG PO TABS: 1.0000 | ORAL_TABLET | ORAL | Status: DC | PRN

## 2020-12-29 MED ORDER — LACTATED RINGERS IV SOLN: INTRAVENOUS | Status: DC

## 2020-12-29 MED ORDER — EPHEDRINE 5 MG/ML INJ: 10.0000 mg | INTRAVENOUS | Status: DC | PRN

## 2020-12-29 MED ORDER — ONDANSETRON HCL 4 MG/2ML IJ SOLN: 4.0000 mg | Freq: Four times a day (QID) | INTRAMUSCULAR | Status: DC | PRN

## 2020-12-29 MED ORDER — PHENYLEPHRINE 40 MCG/ML (10ML) SYRINGE FOR IV PUSH (FOR BLOOD PRESSURE SUPPORT): 80.0000 ug | PREFILLED_SYRINGE | INTRAVENOUS | Status: DC | PRN

## 2020-12-29 MED ORDER — LIDOCAINE HCL (PF) 1 % IJ SOLN: 30.0000 mL | INTRAMUSCULAR | Status: DC | PRN

## 2020-12-29 MED ORDER — ONDANSETRON HCL 4 MG/2ML IJ SOLN: 4.0000 mg | INTRAMUSCULAR | Status: DC | PRN

## 2020-12-29 MED ORDER — OXYTOCIN-SODIUM CHLORIDE 30-0.9 UT/500ML-% IV SOLN
2.5000 [IU]/h | INTRAVENOUS | Status: DC
  Administered 2020-12-29: 2.5 [IU]/h via INTRAVENOUS
  Filled 2020-12-29: qty 500

## 2020-12-29 MED ORDER — BENZOCAINE-MENTHOL 20-0.5 % EX AERO
1.0000 "application " | INHALATION_SPRAY | CUTANEOUS | Status: DC | PRN
  Filled 2020-12-29: qty 56

## 2020-12-29 MED ORDER — ACETAMINOPHEN 325 MG PO TABS: 650.0000 mg | ORAL_TABLET | ORAL | Status: DC | PRN

## 2020-12-29 MED ORDER — COCONUT OIL OIL
1.0000 "application " | TOPICAL_OIL | Status: DC | PRN
  Administered 2020-12-30: 1 via TOPICAL

## 2020-12-29 MED ORDER — DIBUCAINE (PERIANAL) 1 % EX OINT: 1.0000 "application " | TOPICAL_OINTMENT | CUTANEOUS | Status: DC | PRN

## 2020-12-29 MED ORDER — SENNOSIDES-DOCUSATE SODIUM 8.6-50 MG PO TABS
2.0000 | ORAL_TABLET | ORAL | Status: DC
  Administered 2020-12-29: 2 via ORAL
  Filled 2020-12-29: qty 2

## 2020-12-29 MED ORDER — OXYCODONE-ACETAMINOPHEN 5-325 MG PO TABS: 2.0000 | ORAL_TABLET | ORAL | Status: DC | PRN

## 2020-12-29 MED ORDER — PRENATAL MULTIVITAMIN CH
1.0000 | ORAL_TABLET | Freq: Every day | ORAL | Status: DC
  Administered 2020-12-29 – 2020-12-30 (×2): 1 via ORAL
  Filled 2020-12-29 (×2): qty 1

## 2020-12-29 MED ORDER — TETANUS-DIPHTH-ACELL PERTUSSIS 5-2.5-18.5 LF-MCG/0.5 IM SUSY: 0.5000 mL | PREFILLED_SYRINGE | Freq: Once | INTRAMUSCULAR | Status: DC

## 2020-12-29 MED ORDER — FENTANYL-BUPIVACAINE-NACL 0.5-0.125-0.9 MG/250ML-% EP SOLN: 12.0000 mL/h | EPIDURAL | Status: DC | PRN

## 2020-12-29 MED ORDER — LACTATED RINGERS IV SOLN: 500.0000 mL | Freq: Once | INTRAVENOUS | Status: DC

## 2020-12-29 MED ORDER — ZOLPIDEM TARTRATE 5 MG PO TABS: 5.0000 mg | ORAL_TABLET | Freq: Every evening | ORAL | Status: DC | PRN

## 2020-12-29 MED ORDER — SOD CITRATE-CITRIC ACID 500-334 MG/5ML PO SOLN: 30.0000 mL | ORAL | Status: DC | PRN

## 2020-12-29 MED ORDER — DIPHENHYDRAMINE HCL 50 MG/ML IJ SOLN: 12.5000 mg | INTRAMUSCULAR | Status: DC | PRN

## 2020-12-29 MED ORDER — OXYTOCIN BOLUS FROM INFUSION
333.0000 mL | Freq: Once | INTRAVENOUS | Status: AC
  Administered 2020-12-29: 333 mL via INTRAVENOUS

## 2020-12-29 MED ORDER — SIMETHICONE 80 MG PO CHEW: 80.0000 mg | CHEWABLE_TABLET | ORAL | Status: DC | PRN

## 2020-12-29 MED ORDER — IBUPROFEN 600 MG PO TABS
600.0000 mg | ORAL_TABLET | Freq: Four times a day (QID) | ORAL | Status: DC
  Administered 2020-12-29 – 2020-12-30 (×4): 600 mg via ORAL
  Filled 2020-12-29 (×5): qty 1

## 2020-12-29 MED ORDER — ONDANSETRON HCL 4 MG PO TABS: 4.0000 mg | ORAL_TABLET | ORAL | Status: DC | PRN

## 2020-12-29 NOTE — Progress Notes (Signed)
Patient ID: Ashlee Vincent, female   DOB: [birth year], 25 y.o.   MRN: [id number] Requesting epidural  Vitals:   12/29/20 0125 12/29/20 0159 12/29/20 0310 12/29/20 0411  BP: 126/66 127/67 123/68 119/71  Pulse: 91 73 77 82  Resp: 20 18  20   Temp: 98.9 F (37.2 C)   98.7 F (37.1 C)  TempSrc: Oral   Oral   FHR reassuring UCs regular  Dilation: 8.5 Effacement (%): 100 Station: 0 Presentation: Vertex Exam by:: Hansel Feinstein CNM   Pt elects to just get another dose of IV Fentanyl  Anticipate SVD soon

## 2020-12-29 NOTE — MAU Note (Signed)
Ctx since Tuesday. Pt reports she was here earlier today and was 4cm. Pt was d/c home.   Denies vaginal bleeding or LOF.   Reports +FM

## 2020-12-29 NOTE — Discharge Summary (Signed)
Postpartum Discharge Summary  Date of Service updated 12/30/20     Patient Name: Ashlee Vincent DOB: Dec 22, 1995 MRN: [id number]  Date of admission: 12/29/2020 Delivery date:12/29/2020  Delivering provider: Seabron Spates  Date of discharge: 12/30/2020  Admitting diagnosis: Uterine contractions during pregnancy [O47.9] Intrauterine pregnancy: [redacted]w[redacted]d    Secondary diagnosis:  Active Problems:   Uterine contractions during pregnancy   Vaginal delivery  Additional problems: none    Discharge diagnosis: Term Pregnancy Delivered                                              Post partum procedures:none Augmentation: AROM Complications: None  Hospital course: Onset of Labor With Vaginal Delivery      25y.o. yo [id number] admitted in Active Labor on 12/29/2020. Patient had an uncomplicated labor course as follows:  Membrane Rupture Time/Date: 2:18 AM ,12/29/2020   Delivery Method:Vaginal, Spontaneous  Episiotomy: None  Lacerations:  None  Patient had an uncomplicated postpartum course.  She is ambulating, tolerating a regular diet, passing flatus, and urinating well. Patient is discharged home in stable condition on 12/30/20.  Newborn Data: Birth date:12/29/2020  Birth time:4:51 AM  Gender:Female  Living status:Living  Apgars:8 ,9  Weight:3070 g   Magnesium Sulfate received: No BMZ received: No Rhophylac:N/A MMR:No T-DaP:no Flu: No Transfusion:No  Physical exam  Vitals:   12/29/20 1145 12/29/20 1608 12/29/20 2128 12/30/20 0546  BP: (!) 104/57 105/75 (!) 100/59 102/66  Pulse: 72 84 84 93  Resp: 12 18 16 18   Temp: 98.8 F (37.1 C) 98.2 F (36.8 C) 98.1 F (36.7 C) 98.3 F (36.8 C)  TempSrc: Oral Oral Oral Oral  SpO2: 98%  99% 99%   General: alert, cooperative and no distress Lochia: appropriate Uterine Fundus: firm Incision: N/A DVT Evaluation: No evidence of DVT seen on physical exam. Labs: Lab Results  Component Value Date   WBC 9.1 12/29/2020    HGB 12.8 12/29/2020   HCT 41.6 12/29/2020   MCV 93.3 12/29/2020   PLT 238 12/29/2020   CMP Latest Ref Rng & Units 05/17/2017  Glucose 65 - 99 mg/dL 101(H)  BUN 6 - 20 mg/dL 10  Creatinine 0.44 - 1.00 mg/dL 0.53  Sodium 135 - 145 mmol/L 143  Potassium 3.5 - 5.1 mmol/L 4.0  Chloride 101 - 111 mmol/L 111  CO2 22 - 32 mmol/L 24  Calcium 8.9 - 10.3 mg/dL 9.0  Total Protein 6.5 - 8.1 g/dL 7.5  Total Bilirubin 0.3 - 1.2 mg/dL 1.0  Alkaline Phos 38 - 126 U/L 86  AST 15 - 41 U/L 16  ALT 14 - 54 U/L 17   Edinburgh Score: Edinburgh Postnatal Depression Scale Screening Tool 12/29/2020  I have been able to laugh and see the funny side of things. 3  I have looked forward with enjoyment to things. 0  I have blamed myself unnecessarily when things went wrong. 0  I have been anxious or worried for no good reason. 0  I have felt scared or panicky for no good reason. 0  Things have been getting on top of me. 0  I have been so unhappy that I have had difficulty sleeping. 0  I have felt sad or miserable. 0  I have been so unhappy that I have been crying. 0  The thought of harming myself  has occurred to me. 0  Edinburgh Postnatal Depression Scale Total 3      After visit meds:  Allergies as of 12/30/2020   No Known Allergies     Medication List    TAKE these medications   acetaminophen 325 MG tablet Commonly known as: TYLENOL Take 650 mg by mouth every 6 (six) hours as needed for mild pain or headache.   Blood Pressure Monitor Misc For regular home bp monitoring during pregnancy   calcium carbonate 500 MG chewable tablet Commonly known as: TUMS - dosed in mg elemental calcium Chew 1 tablet by mouth daily as needed for indigestion or heartburn.   ibuprofen 600 MG tablet Commonly known as: ADVIL Take 1 tablet (600 mg total) by mouth every 6 (six) hours.   prenatal multivitamin Tabs tablet Take 1 tablet by mouth daily at 12 noon.       Please schedule this patient for  Postpartum visit in: 4 weeks with the following provider: Any provider In-Person For C/S patients schedule nurse incision check in weeks 2 weeks: no Low risk pregnancy complicated by: asymptomatic bacteruria Delivery mode:  SVD Anticipated Birth Control:  Nexplanon PP Procedures needed: Nexplanon  Edinburgh: negative Schedule Integrated BH visit: no  No relevant baby issues     Discharge home in stable condition Infant Feeding: Breast Infant Disposition:home with mother Discharge instruction: per After Visit Summary and Postpartum booklet. Activity: Advance as tolerated. Pelvic rest for 6 weeks.  Diet: routine diet Anticipated Birth Control: Nexplanon Postpartum Appointment:4 weeks Additional Postpartum F/U: n/a Future Appointments: Future Appointments  Date Time Provider [place]  02/02/2021 10:10 AM Roma Schanz, CNM [facility]   Follow up Visit:      12/30/2020 Fatima Blank, CNM

## 2020-12-29 NOTE — Progress Notes (Signed)
Patient ID: Ashlee Vincent, female   DOB: May 27, 1996, 25 y.o.   MRN: [id number] Requesting AROM  Vitals:   12/29/20 0125 12/29/20 0159 12/29/20 0310  BP: 126/66 127/67 123/68  Pulse: 91 73 77  Resp: 20 18   Temp: 98.9 F (37.2 C)    TempSrc: Oral     FHR reassuring UCs every 2-80min  Dilation: 5.5 Effacement (%): 100 Station: -1 Presentation: Vertex Exam by:: Hansel Feinstein CNM  AROM clear fluid

## 2020-12-29 NOTE — H&P (Signed)
Ashlee Vincent is a 25 y.o. female presenting for worsening contractions.  Was seen earlier and was 4cm.  Is now 5+  Pregnancy has been followed by [facility] and remarkable for Patient Active Problem List   Diagnosis Date Noted  . Uterine contractions during pregnancy 12/29/2020  . Encounter for supervision of normal pregnancy in multigravida in first trimester 06/21/2020  . Asymptomatic bacteriuria during pregnancy in second trimester 12/08/2017   . OB History    Gravida  6   Para  3   Term  3   Preterm  0   AB  2   Living  3     SAB  2   IAB  0   Ectopic  0   Multiple  0   Live Births  3          Past Medical History:  Diagnosis Date  . Medical history non-contributory    Past Surgical History:  Procedure Laterality Date  . NO PAST SURGERIES     Family History: family history includes Diabetes in her maternal grandmother and paternal grandmother. Social History:  reports that she has never smoked. She has never used smokeless tobacco. She reports that she does not drink alcohol and does not use drugs.     Maternal Diabetes: No Genetic Screening: Normal Maternal Ultrasounds/Referrals: Normal Fetal Ultrasounds or other Referrals:  None Maternal Substance Abuse:  No Significant Maternal Medications:  None Significant Maternal Lab Results:  Group B Strep negative Other Comments:  None  Review of Systems  Constitutional: Negative for chills and fever.  Eyes: Negative for visual disturbance.  Respiratory: Negative for shortness of breath.   Gastrointestinal: Positive for abdominal pain.  Genitourinary: Positive for pelvic pain. Negative for vaginal bleeding.  Neurological: Negative for headaches.   Maternal Medical History:  Reason for admission: Contractions.   Contractions: Onset was 6-12 hours ago.   Frequency: irregular.   Perceived severity is moderate.    Fetal activity: Perceived fetal activity is normal.   Last perceived fetal  movement was within the past hour.    Prenatal complications: No bleeding, PIH, infection, IUGR, placental abnormality, pre-eclampsia, substance abuse or thrombocytopenia.   Prenatal Complications - Diabetes: none.    Dilation: 5.5 Effacement (%): 80 Station: -2 Exam by:: Cloretta Ned, RN Last menstrual period 03/15/2020, currently breastfeeding. Maternal Exam:  Uterine Assessment: Contraction strength is moderate.  Contraction frequency is irregular.   Abdomen: Patient reports no abdominal tenderness. Fetal presentation: vertex  Introitus: Normal vulva. Normal vagina.  Ferning test: not done.  Nitrazine test: not done. Amniotic fluid character: not assessed.  Pelvis: adequate for delivery.   Cervix: Cervix evaluated by digital exam.     Fetal Exam Fetal Monitor Review: Mode: ultrasound.   Baseline rate: 135.  Variability: moderate (6-25 bpm).   Pattern: accelerations present and no decelerations.    Fetal State Assessment: Category I - tracings are normal.     Physical Exam Constitutional:      General: She is not in acute distress.    Appearance: She is not ill-appearing.  HENT:     Head: Normocephalic.  Cardiovascular:     Rate and Rhythm: Normal rate.  Pulmonary:     Effort: Pulmonary effort is normal. No respiratory distress.  Abdominal:     General: There is no distension.     Tenderness: There is no abdominal tenderness. There is no guarding or rebound.  Genitourinary:    General: Normal vulva.  Comments: Dilation: 5.5 Effacement (%): 100 Station: -1 Presentation: Vertex Exam by:: Hansel Feinstein CNM  Musculoskeletal:        General: Normal range of motion.     Cervical back: Normal range of motion.  Skin:    General: Skin is warm and dry.  Neurological:     General: No focal deficit present.     Mental Status: She is alert.  Psychiatric:        Mood and Affect: Mood normal.     Prenatal labs: ABO, Rh: O/Positive/-- (08/05  1049) Antibody: Negative (12/16 0842) Rubella: 2.44 (08/05 1049) RPR: Non Reactive (12/16 0842)  HBsAg: Negative (08/05 1049)  HIV: Non Reactive (12/16 0842)  GBS: Negative/-- (01/19 1555)   Assessment/Plan: Single IUP at [redacted]w[redacted]d Uterine Contractions, active Labor GBS Neg  Admit to Labor and delivery Routine orders Desires IV analgesia, declines epidural    Hansel Feinstein 12/29/2020, 1:14 AM

## 2020-12-30 MED ORDER — IBUPROFEN 600 MG PO TABS: 600.0000 mg | ORAL_TABLET | Freq: Four times a day (QID) | ORAL | 0 refills | Status: DC

## 2021-01-01 ENCOUNTER — Encounter: Payer: Self-pay | Admitting: Obstetrics & Gynecology

## 2021-02-02 ENCOUNTER — Encounter: Payer: Self-pay | Admitting: Women's Health

## 2021-02-02 ENCOUNTER — Other Ambulatory Visit: Payer: Self-pay

## 2021-02-02 ENCOUNTER — Ambulatory Visit (INDEPENDENT_AMBULATORY_CARE_PROVIDER_SITE_OTHER): Payer: Self-pay | Admitting: Women's Health

## 2021-02-02 DIAGNOSIS — Z3009 Encounter for other general counseling and advice on contraception: Secondary | ICD-10-CM

## 2021-02-02 NOTE — Patient Instructions (Signed)
Call the health department to schedule pap smear and Nexplanon insertion

## 2021-02-02 NOTE — Progress Notes (Signed)
POSTPARTUM VISIT Patient name: Ashlee Vincent MRN [id number]  Date of birth: May 06, 1996 Chief Complaint:   Postpartum Care  History of Present Illness:   Ashlee Vincent is a 25 y.o. [id number] Hispanic female being seen today for a postpartum visit. She is 5 weeks postpartum following a spontaneous vaginal delivery at 39.4 gestational weeks. IOL: No, for n/a. Anesthesia: none.  Laceration: none.  Complications: none. Inpatient contraception: no.   Pregnancy uncomplicated. Tobacco use: no. Substance use disorder: no. Last pap smear: 01/07/18 and results were NILM w/ HRHPV not done. Next pap smear due: now- wants to check w/ [facility] (is self-pay) Patient's last menstrual period was 03/15/2020 (exact date).  Postpartum course has been uncomplicated. Bleeding no bleeding. Bowel function is normal. Bladder function is normal. Urinary incontinence? No, fecal incontinence? No Patient is not sexually active. Last sexual activity: prior to birth of baby.  Desired contraception: Nexplanon-wants to check price at [facility] (is self-pay). Patient does want a pregnancy in the future.  Desired family size is 5 children.   Upstream - 02/02/21 1017      Pregnancy Intention Screening   Does the patient want to become pregnant in the next year? No    Does the patient's partner want to become pregnant in the next year? No    Would the patient like to discuss contraceptive options today? Yes      Contraception Wrap Up   Current Method Abstinence    Contraception Counseling Provided Yes          The pregnancy intention screening data noted above was reviewed. Potential methods of contraception were discussed. The patient elected to proceed with Abstinence then nexplanon w/ [facility]  Edinburgh Postpartum Depression Screening: Negative  Edinburgh Postnatal Depression Scale - 02/02/21 1016      Edinburgh Postnatal Depression Scale:  In the Past 7 Days   I have been able to laugh and see the funny side of things. 0     I have looked forward with enjoyment to things. 0    I have blamed myself unnecessarily when things went wrong. 1    I have been anxious or worried for no good reason. 1    I have felt scared or panicky for no good reason. 0    Things have been getting on top of me. 0    I have been so unhappy that I have had difficulty sleeping. 0    I have felt sad or miserable. 1    I have been so unhappy that I have been crying. 0    The thought of harming myself has occurred to me. 0    Edinburgh Postnatal Depression Scale Total 3          Baby's course has been uncomplicated. Baby is feeding by bottle. Infant has a pediatrician/family doctor? Yes.  Childcare strategy if returning to work/school: n/a.  Pt has material needs met for her and baby: Yes.   Review of Systems:   Pertinent items are noted in HPI Denies Abnormal vaginal discharge w/ itching/odor/irritation, headaches, visual changes, shortness of breath, chest pain, abdominal pain, severe nausea/vomiting, or problems with urination or bowel movements. Pertinent History Reviewed:  Reviewed past medical,surgical, obstetrical and family history.  Reviewed problem list, medications and allergies. OB History  Gravida Para Term Preterm AB Living  _0 0 2 4  SAB IAB Ectopic Multiple Live Births  2 0 0 0 4    # Outcome Date GA Lbr Len/2nd Weight  Sex Delivery Anes PTL Lv  6 Term 12/29/20 71w4d03:57 / 00:03 6 lb 12.3 oz (3.07 kg) F Vag-Spont None  LIV  5 Term 04/19/18 395w5d4:05 / 00:17 6 lb 8.6 oz (2.965 kg) F Vag-Spont None  LIV     Birth Comments: none  4 Term 08/14/15 3918w3d:30 / 00:16 7 lb 8.8 oz (3.425 kg) F Vag-Spont Local N LIV     Birth Comments: WNL  3 SAB 06/27/14 8w334w3d        Birth Comments: System Generated. Please review and update pregnancy details.  2 SAB 02/16/14          1 Term 02/21/10 38w017w0db 14 oz (3.572 kg) F Vag-Spont None N LIV   Physical Assessment:   Vitals:   02/02/21 1018  BP: 105/64  Pulse:  74  Weight: 201 lb (91.2 kg)  Height: 5' (1.524 m)  Body mass index is 39.26 kg/m.       Physical Examination:   General appearance: alert, well appearing, and in no distress  Mental status: alert, oriented to person, place, and time  Skin: warm & dry   Cardiovascular: normal heart rate noted   Respiratory: normal respiratory effort, no distress   Breasts: deferred, no complaints   Abdomen: soft, non-tender   Pelvic: examination not indicated  Rectal: not examined  Extremities: no edema  Chaperone: N/A         No results found for this or any previous visit (from the past 24 hour(s)).  Assessment & Plan:  1) Postpartum exam 2) 5 wks s/p spontaneous vaginal delivery 3) bottle feeding 4) Depression screening 5) Contraception counseling, abstinence, call [facility] to check self-pay price for Nexplanon 6) Due for pap> self-pay, wants to have done at [facility] today to schedule  Essential components of care per ACOG recommendations:  1.  Mood and well being:  . If positive depression screen, discussed and plan developed.  . If using tobacco we discussed reduction/cessation and risk of relapse . If current substance abuse, we discussed and referral to local resources was offered.   2. Infant care and feeding:  . If breastfeeding, discussed returning to work, pumping, breastfeeding-associated pain, guidance regarding return to fertility while lactating if not using another method. If needed, patient was provided with a letter to be allowed to pump q 2-3hrs to support lactation in a private location with access to a refrigerator to store breastmilk.   . Recommended that all caregivers be immunized for flu, pertussis and other preventable communicable diseases . If pt does not have material needs met for her/baby, referred to local resources for help obtaining these.  3. Sexuality, contraception and birth spacing . Provided guidance regarding sexuality, management of dyspareunia, and  resumption of intercourse . Discussed avoiding interpregnancy interval <6mths52md recommended birth spacing of 18 months  4. Sleep and fatigue . Discussed coping options for fatigue and sleep disruption . Encouraged family/partner/community support of 4 hrs of uninterrupted sleep to help with mood and fatigue  5. Physical recovery  . If pt had a C/S, assessed incisional pain and providing guidance on normal vs prolonged recovery . If pt had a laceration, perineal healing and pain reviewed.  . If urinary or fecal incontinence, discussed management and referred to PT or uro/gyn if indicated  . Patient is safe to resume physical activity. Discussed attainment of healthy weight.  6.  Chronic disease management . Discussed pregnancy complications if any,  and their implications for future childbearing and long-term maternal health. . Review recommendations for prevention of recurrent pregnancy complications, such as 17 hydroxyprogesterone caproate to reduce risk for recurrent PTB not applicable, or aspirin to reduce risk of preeclampsia not applicable. . Pt had GDM: No. If yes, 2hr GTT scheduled: not applicable. . Reviewed medications and non-pregnant dosing including consideration of whether pt is breastfeeding using a reliable resource such as LactMed: not applicable . Referred for f/u w/ PCP or subspecialist providers as indicated: not applicable  7. Health maintenance . Mammogram at 26yo or earlier if indicated . Pap smears as indicated  Meds: No orders of the defined types were placed in this encounter.   Follow-up: Return for prn.   No orders of the defined types were placed in this encounter.   [place], [facility] 02/02/2021 11:06 AM

## 2021-06-20 ENCOUNTER — Other Ambulatory Visit: Payer: Self-pay

## 2021-06-20 ENCOUNTER — Encounter (HOSPITAL_COMMUNITY): Payer: Self-pay | Admitting: Emergency Medicine

## 2021-06-20 ENCOUNTER — Emergency Department (HOSPITAL_COMMUNITY)
Admission: EM | Admit: 2021-06-20 | Discharge: 2021-06-20 | Disposition: A | Discharge status: Home / Self Care | Payer: Self-pay | Attending: Emergency Medicine | Admitting: Emergency Medicine

## 2021-06-20 DIAGNOSIS — R519 Headache, unspecified: Secondary | ICD-10-CM | POA: Insufficient documentation

## 2021-06-20 DIAGNOSIS — R42 Dizziness and giddiness: Secondary | ICD-10-CM | POA: Insufficient documentation

## 2021-06-20 DIAGNOSIS — H5713 Ocular pain, bilateral: Secondary | ICD-10-CM

## 2021-06-20 MED ORDER — BUTALBITAL-APAP-CAFFEINE 50-325-40 MG PO TABS: 1.0000 | ORAL_TABLET | Freq: Four times a day (QID) | ORAL | 0 refills | Status: AC | PRN

## 2021-06-20 MED ORDER — TETRACAINE HCL 0.5 % OP SOLN
2.0000 [drp] | Freq: Once | OPHTHALMIC | Status: AC
  Administered 2021-06-20: 2 [drp] via OPHTHALMIC
  Filled 2021-06-20: qty 4

## 2021-06-20 MED ORDER — FLUORESCEIN SODIUM 1 MG OP STRP
1.0000 | ORAL_STRIP | Freq: Once | OPHTHALMIC | Status: AC
  Administered 2021-06-20: 1 via OPHTHALMIC
  Filled 2021-06-20: qty 1

## 2021-06-20 NOTE — Discharge Instructions (Addendum)
You have been evaluated for your eye pain.  This could be related to your headache.  Take medication as prescribed.  Call and follow-up closely with ophthalmologist for further evaluation and management.  Return if you have any concern.

## 2021-06-20 NOTE — ED Notes (Signed)
Left eye 10/16 Right eye 10/20

## 2021-06-20 NOTE — ED Triage Notes (Signed)
Patient complains of bilateral eye pain and photosensitivity that started approximately one week ago. Denies drainage. Patient alert, oriented, and in no apparent distress at this time.

## 2021-06-20 NOTE — ED Provider Notes (Signed)
[facility] EMERGENCY DEPARTMENT Provider Note   CSN: [id number] Arrival date & time: 06/20/21  1126     History Chief Complaint  Patient presents with   Eye Pain    Ashlee Vincent is a 25 y.o. female.  The history is provided by the patient. No language interpreter was used.  Eye Pain Associated symptoms include headaches.   25 year old female who presents for evaluation of eye pain.  Patient report for the past 3 days she has had persistent pain to both eyes.  Described pain as a achy sensation hurts with eye movement, and/or some light sensitivity.  Furthermore she also endorsed a throbbing headache, and some mild dizziness.  Symptoms moderate in severity.  No associated fever runny nose sneezing coughing sore throat change in vision double vision loss of vision or redness.  No eye injury.  She does wear prescription glasses but denies any recent change in her prescription glasses.  She did take Tylenol at home for headache with some improvement.  Also mention recently diagnosed with some type of infection in her cervix and was prescribed doxycycline and antifungal medication.  Past Medical History:  Diagnosis Date   Medical history non-contributory     There are no problems to display for this patient.   Past Surgical History:  Procedure Laterality Date   NO PAST SURGERIES       OB History     Gravida  6   Para  4   Term  4   Preterm  0   AB  2   Living  4      SAB  2   IAB  0   Ectopic  0   Multiple  0   Live Births  4           Family History  Problem Relation Age of Onset   Diabetes Maternal Grandmother    Diabetes Paternal Grandmother     Social History   Tobacco Use   Smoking status: Never   Smokeless tobacco: Never  Vaping Use   Vaping Use: Never used  Substance Use Topics   Alcohol use: No   Drug use: No    Home Medications Prior to Admission medications   Medication Sig Start Date End Date Taking?  Authorizing Provider  acetaminophen (TYLENOL) 325 MG tablet Take 650 mg by mouth every 6 (six) hours as needed for mild pain or headache.    [provider]  Blood Pressure Monitor MISC For regular home bp monitoring during pregnancy 06/22/20   Roma Schanz, CNM  calcium carbonate (TUMS - DOSED IN MG ELEMENTAL CALCIUM) 500 MG chewable tablet Chew 1 tablet by mouth daily as needed for indigestion or heartburn.    [provider]  ibuprofen (ADVIL) 600 MG tablet Take 1 tablet (600 mg total) by mouth every 6 (six) hours. 12/30/20   Leftwich-Kirby, Kathie Dike, CNM  Prenatal Vit-Fe Fumarate-FA (PRENATAL MULTIVITAMIN) TABS tablet Take 1 tablet by mouth daily at 12 noon. 04/20/18   Bonnita Hollow, MD    Allergies    Patient has no known allergies.  Review of Systems   Review of Systems  Constitutional:  Negative for fever.  Eyes:  Positive for pain.  Neurological:  Positive for headaches.   Physical Exam Updated Vital Signs BP 118/81 (BP Location: Left Arm)   Pulse 67   Temp 98.5 F (36.9 C) (Oral)   Resp 16   SpO2 98%   Physical Exam Vitals  and nursing note reviewed.  Constitutional:      General: She is not in acute distress.    Appearance: She is well-developed.  HENT:     Head: Atraumatic.  Eyes:     General: Lids are normal. Lids are everted, no foreign bodies appreciated. Vision grossly intact. Gaze aligned appropriately. No scleral icterus.       Right eye: No discharge.        Left eye: No discharge.     Intraocular pressure: Right eye pressure is 19 mmHg. Left eye pressure is 14 mmHg.     Extraocular Movements: Extraocular movements intact.     Conjunctiva/sclera: Conjunctivae normal.     Right eye: Right conjunctiva is not injected.     Left eye: Left conjunctiva is not injected.     Pupils: Pupils are equal, round, and reactive to light.     Slit lamp exam:    Right eye: No corneal flare, corneal ulcer, foreign body, hyphema or photophobia.     Left  eye: No corneal flare, corneal ulcer, foreign body, hyphema or photophobia.  Pulmonary:     Effort: Pulmonary effort is normal.  Musculoskeletal:     Cervical back: Neck supple.  Skin:    Findings: No rash.  Neurological:     Mental Status: She is alert.  Psychiatric:        Mood and Affect: Mood normal.    ED Results / Procedures / Treatments   Labs (all labs ordered are listed, but only abnormal results are displayed) Labs Reviewed - No data to display  EKG None  Radiology No results found.  Procedures Procedures   Medications Ordered in ED Medications  fluorescein ophthalmic strip 1 strip (has no administration in time range)  tetracaine (PONTOCAINE) 0.5 % ophthalmic solution 2 drop (has no administration in time range)    ED Course  I have reviewed the triage vital signs and the nursing notes.  Pertinent labs & imaging results that were available during my care of the patient were reviewed by me and considered in my medical decision making (see chart for details).    MDM Rules/Calculators/A&P                           BP 118/81 (BP Location: Left Arm)   Pulse 67   Temp 98.5 F (36.9 C) (Oral)   Resp 16   SpO2 98%   Final Clinical Impression(s) / ED Diagnoses Final diagnoses:  Pain of both eyes    Rx / DC Orders ED Discharge Orders          Ordered    butalbital-acetaminophen-caffeine (FIORICET) 50-325-40 MG tablet  Every 6 hours PRN        06/20/21 1306           12:00 PM Patient here with complaint of headache and bilateral eye pain for the past several days.  Exam fairly unremarkable, conjunctiva is not injected, extraocular movements intact and pupils equal round reactive to light and accommodation.  No red flags.  1:02 PM Patient report improvement of pain with tetracaine drop.  On visual acuity her left eye is 10/16, right is 10/20.  Patient has normal intraocular pressure.  No fluorescein stain uptake or any concerning uptake on  fluorescein stain.  No concern eye finding at this time.  Will give referral to ophthalmologist for outpt eval. Headache medication prescribed.  Return precaution given.  Suspect tension headache  Domenic Moras, PA-C 06/20/21 1308    [place], Alvin Critchley, DO 06/22/21 [phone]

## 2021-09-22 ENCOUNTER — Ambulatory Visit
Admission: EM | Admit: 2021-09-22 | Discharge: 2021-09-22 | Disposition: A | Discharge status: Home / Self Care | Payer: Self-pay | Attending: Family Medicine | Admitting: Family Medicine

## 2021-09-22 ENCOUNTER — Other Ambulatory Visit: Payer: Self-pay

## 2021-09-22 ENCOUNTER — Encounter: Payer: Self-pay | Admitting: Emergency Medicine

## 2021-09-22 DIAGNOSIS — H66001 Acute suppurative otitis media without spontaneous rupture of ear drum, right ear: Secondary | ICD-10-CM

## 2021-09-22 MED ORDER — AMOXICILLIN 875 MG PO TABS: 875.0000 mg | ORAL_TABLET | Freq: Two times a day (BID) | ORAL | 0 refills | Status: AC

## 2021-09-22 NOTE — ED Triage Notes (Addendum)
Pt is present today with right ear pain and right side dental pain. Pt states that she also has some swelling that she noticed last night Pt states she noticed the pain yesterday

## 2021-09-24 NOTE — ED Provider Notes (Signed)
  [facility]   [id number] 09/22/21 Arrival Time: 8921  ASSESSMENT & PLAN:  1. Non-recurrent acute suppurative otitis media of right ear without spontaneous rupture of tympanic membrane     OTC symptom care as needed. Begin: Meds ordered this encounter  Medications   amoxicillin (AMOXIL) 875 MG tablet    Sig: Take 1 tablet (875 mg total) by mouth 2 (two) times daily for 7 days.    Dispense:  14 tablet    Refill:  0     Follow-up Information      Urgent Care at [facility].   Specialty: Urgent Care Why: If worsening or failing to improve as anticipated. Contact information: [address] [place] [address] [phone]                Reviewed expectations re: course of current medical issues. Questions answered. Outlined signs and symptoms indicating need for more acute intervention. Understanding verbalized. After Visit Summary given.   SUBJECTIVE: History from: patient. Ashlee Vincent is a 25 y.o. female who reports: R ear pain/pressure; abrupt onset; 1-2 d; without drainage or bleeding; no tx PTA. Denies: fever and difficulty breathing. Normal PO intake without n/v/d.   OBJECTIVE:  Vitals:   09/22/21 1344  BP: 111/68  Pulse: 68  Resp: 18  Temp: 98 F (36.7 C)  SpO2: 96%    General appearance: alert; no distress Eyes: PERRLA; EOMI; conjunctiva normal HENT: [place]; AT; with nasal congestion; R TM erythematous and bulging Neck: supple  Lungs: speaks full sentences without difficulty; unlabored Extremities: no edema Skin: warm and dry Neurologic: normal gait Psychological: alert and cooperative; normal mood and affect    No Known Allergies  Past Medical History:  Diagnosis Date   Medical history non-contributory    Social History   Socioeconomic History   Marital status: Single    Spouse name: Not on file   Number of children: Not on file   Years of education: Not on file   Highest  education level: Not on file  Occupational History   Not on file  Tobacco Use   Smoking status: Never   Smokeless tobacco: Never  Vaping Use   Vaping Use: Never used  Substance and Sexual Activity   Alcohol use: No   Drug use: No   Sexual activity: Yes    Birth control/protection: None  Other Topics Concern   Not on file  Social History Narrative   Not on file   Social Determinants of Health   Financial Resource Strain: Not on file  Food Insecurity: Not on file  Transportation Needs: Not on file  Physical Activity: Not on file  Stress: Not on file  Social Connections: Not on file  Intimate Partner Violence: Not on file   Family History  Problem Relation Age of Onset   Diabetes Maternal Grandmother    Diabetes Paternal Grandmother    Past Surgical History:  Procedure Laterality Date   NO PAST SURGERIES       Vanessa Kick, MD 09/24/21 [phone]

## 2023-06-03 ENCOUNTER — Ambulatory Visit
Admission: EM | Admit: 2023-06-03 | Discharge: 2023-06-03 | Disposition: A | Discharge status: Home / Self Care | Payer: Self-pay | Attending: Nurse Practitioner | Admitting: Nurse Practitioner

## 2023-06-03 DIAGNOSIS — M545 Low back pain, unspecified: Secondary | ICD-10-CM | POA: Insufficient documentation

## 2023-06-03 DIAGNOSIS — R109 Unspecified abdominal pain: Secondary | ICD-10-CM | POA: Insufficient documentation

## 2023-06-03 LAB — POCT URINALYSIS DIP (MANUAL ENTRY)
Bilirubin, UA: NEGATIVE
Glucose, UA: NEGATIVE mg/dL
Ketones, POC UA: NEGATIVE mg/dL
Nitrite, UA: NEGATIVE
Protein Ur, POC: 30 mg/dL — AB
Spec Grav, UA: 1.02 (ref 1.010–1.025)
Urobilinogen, UA: 1 E.U./dL
pH, UA: 6.5 (ref 5.0–8.0)

## 2023-06-03 MED ORDER — KETOROLAC TROMETHAMINE 30 MG/ML IJ SOLN
30.0000 mg | Freq: Once | INTRAMUSCULAR | Status: AC
  Administered 2023-06-03: 30 mg via INTRAMUSCULAR

## 2023-06-03 MED ORDER — IBUPROFEN 800 MG PO TABS: 800.0000 mg | ORAL_TABLET | Freq: Three times a day (TID) | ORAL | 0 refills | Status: DC | PRN

## 2023-06-03 MED ORDER — TAMSULOSIN HCL 0.4 MG PO CAPS: 0.4000 mg | ORAL_CAPSULE | Freq: Every day | ORAL | 0 refills | Status: DC

## 2023-06-03 NOTE — ED Triage Notes (Signed)
Pt c/o back pain onset 2 days ago intermittently, then yesterday it became constant, pt denies injury, or urinary symptoms.  Pain is localized on the right side middle and lower back into the right buttocks

## 2023-06-03 NOTE — ED Provider Notes (Signed)
[facility]    CSN: [id number] Arrival date & time: 06/03/23  8416      History   Chief Complaint No chief complaint on file.   HPI Ashlee Vincent is a 27 y.o. female.   The history is provided by the patient.   The patient presents for complaints of low back pain has been present for the past 2 days.  Patient states pain worsened over the past 24 hours.  She states the pain is located on the right side of her back and then radiates into the lower back and to her right buttock. She also has tightness in the lower back.  She denies injury, trauma, urinary frequency, urgency, hesitancy, or hematuria.  Patient states that she has had "chills" and felt "hot".  Patient states that she took Tylenol, which did help with her symptoms.  Patient denies history of kidney stones.  Past Medical History:  Diagnosis Date   Medical history non-contributory     There are no problems to display for this patient.   Past Surgical History:  Procedure Laterality Date   NO PAST SURGERIES      OB History     Gravida  6   Para  4   Term  4   Preterm  0   AB  2   Living  4      SAB  2   IAB  0   Ectopic  0   Multiple  0   Live Births  4            Home Medications    Prior to Admission medications   Medication Sig Start Date End Date Taking? Authorizing Provider  ibuprofen (ADVIL) 800 MG tablet Take 1 tablet (800 mg total) by mouth every 8 (eight) hours as needed. 06/03/23  Yes Alynah Schone-Warren, Sadie Haber, NP  tamsulosin (FLOMAX) 0.4 MG CAPS capsule Take 1 capsule (0.4 mg total) by mouth daily after supper. 06/03/23  Yes Jadiel Schmieder-Warren, Sadie Haber, NP  acetaminophen (TYLENOL) 325 MG tablet Take 650 mg by mouth every 6 (six) hours as needed for mild pain or headache.    [provider]  Blood Pressure Monitor MISC For regular home bp monitoring during pregnancy 06/22/20   Cheral Marker, CNM  calcium carbonate (TUMS - DOSED IN MG ELEMENTAL CALCIUM) 500  MG chewable tablet Chew 1 tablet by mouth daily as needed for indigestion or heartburn.    [provider]  Prenatal Vit-Fe Fumarate-FA (PRENATAL MULTIVITAMIN) TABS tablet Take 1 tablet by mouth daily at 12 noon. 04/20/18   Garnette Gunner, MD    Family History Family History  Problem Relation Age of Onset   Diabetes Maternal Grandmother    Diabetes Paternal Grandmother     Social History Social History   Tobacco Use   Smoking status: Never   Smokeless tobacco: Never  Vaping Use   Vaping status: Never Used  Substance Use Topics   Alcohol use: No   Drug use: No     Allergies   Patient has no known allergies.   Review of Systems Review of Systems Per HPI  Physical Exam Triage Vital Signs ED Triage Vitals  Encounter Vitals Group     BP 06/03/23 0959 114/72     Systolic BP Percentile --      Diastolic BP Percentile --      Pulse Rate 06/03/23 0959 88     Resp 06/03/23 0959 13     Temp  06/03/23 0959 98.2 F (36.8 C)     Temp Source 06/03/23 0959 Oral     SpO2 06/03/23 0959 97 %     Weight --      Height --      Head Circumference --      Peak Flow --      Pain Score 06/03/23 1002 7     Pain Loc --      Pain Education --      Exclude from Growth Chart --    No data found.  Updated Vital Signs BP 114/72 (BP Location: Right Arm)   Pulse 88   Temp 98.2 F (36.8 C) (Oral)   Resp 13   LMP  (LMP Unknown) Comment: pt had IUD causing priods to be irregular  SpO2 97%   Visual Acuity Right Eye Distance:   Left Eye Distance:   Bilateral Distance:    Right Eye Near:   Left Eye Near:    Bilateral Near:     Physical Exam Vitals and nursing note reviewed.  Constitutional:      General: She is not in acute distress.    Appearance: Normal appearance.  HENT:     Head: Normocephalic.  Eyes:     Extraocular Movements: Extraocular movements intact.     Pupils: Pupils are equal, round, and reactive to light.  Cardiovascular:     Rate and Rhythm:  Normal rate and regular rhythm.     Pulses: Normal pulses.     Heart sounds: Normal heart sounds.  Pulmonary:     Effort: Pulmonary effort is normal. No respiratory distress.     Breath sounds: Normal breath sounds. No stridor. No wheezing, rhonchi or rales.  Abdominal:     General: Bowel sounds are normal.     Palpations: Abdomen is soft.     Tenderness: There is no abdominal tenderness. There is no right CVA tenderness or left CVA tenderness.  Musculoskeletal:     Cervical back: Normal range of motion.     Lumbar back: Tenderness (Right paraspinal region around flank and mid lower back at L3-L4) present. No swelling, deformity or signs of trauma. Decreased range of motion. Negative right straight leg raise test.  Lymphadenopathy:     Cervical: No cervical adenopathy.  Skin:    General: Skin is warm and dry.  Neurological:     General: No focal deficit present.     Mental Status: She is alert and oriented to person, place, and time.  Psychiatric:        Mood and Affect: Mood normal.        Behavior: Behavior normal.      UC Treatments / Results  Labs (all labs ordered are listed, but only abnormal results are displayed) Labs Reviewed  POCT URINALYSIS DIP (MANUAL ENTRY) - Abnormal; Notable for the following components:      Result Value   Clarity, UA hazy (*)    Blood, UA trace-lysed (*)    Protein Ur, POC =30 (*)    Leukocytes, UA Trace (*)    All other components within normal limits  URINE CULTURE    EKG   Radiology No results found.  Procedures Procedures (including critical care time)  Medications Ordered in UC Medications  ketorolac (TORADOL) 30 MG/ML injection 30 mg (30 mg Intramuscular Given 06/03/23 1027)    Initial Impression / Assessment and Plan / UC Course  I have reviewed the triage vital signs and the nursing notes.  Pertinent  labs & imaging results that were available during my care of the patient were reviewed by me and considered in my  medical decision making (see chart for details).  The patient is well-appearing, she is in no acute distress, vital signs are stable.  Urinalysis is positive for trace leukocytes, protein, and lysed blood.  A urine culture is pending.  Patient's pain is located around her right kidney, with radiation into the mid lower back.  Differential diagnoses include kidney stone and low back pain.  Toradol 30 mg IM administered for pain.  Will treat patient with ibuprofen 800 mg, and tamsulosin 0.4 mg.  Patient was also provided a strainer for her urine.  Supportive care recommendations were provided and discussed with the patient to include increasing her water intake, warm compresses to the affected area, and to monitor for worsening symptoms.  Patient was advised that if symptoms are not improving with this treatment, it is recommended that she follow-up in the emergency department for further evaluation.  Patient is in agreement with this plan of care and verbalizes understanding.  All questions were answered.  Patient stable for discharge.  Final Clinical Impressions(s) / UC Diagnoses   Final diagnoses:  Right flank pain  Acute midline low back pain without sciatica     Discharge Instructions      As discussed, your urinalysis shows high concern for a possible kidney stone.  A urine culture is pending.  You will be contacted if the pending test result is abnormal. You are given Toradol 30 mg.  Continue to take Tylenol today.  You can begin taking ibuprofen on 06/04/2023. Make sure you are drinking plenty of fluids.  Try to drink at least 10-12 8 ounce glasses of water daily while symptoms persist. May apply warm compresses to the affected area to help with pain or discomfort. Please use the urine strainer provided today to strain your urine to determine whether or not you have passed a kidney stone. As discussed, if symptoms do not improve, or pain seems to worsen, please follow-up in the emergency  department immediately for further evaluation. Follow-up as needed.     ED Prescriptions     Medication Sig Dispense Auth. Provider   ibuprofen (ADVIL) 800 MG tablet Take 1 tablet (800 mg total) by mouth every 8 (eight) hours as needed. 30 tablet Sarabeth Benton-Warren, Sadie Haber, NP   tamsulosin (FLOMAX) 0.4 MG CAPS capsule Take 1 capsule (0.4 mg total) by mouth daily after supper. 30 capsule Aleatha Taite-Warren, Sadie Haber, NP      PDMP not reviewed this encounter.   Abran Cantor, NP 06/03/23 1034

## 2023-06-03 NOTE — Discharge Instructions (Addendum)
As discussed, your urinalysis shows high concern for a possible kidney stone.  A urine culture is pending.  You will be contacted if the pending test result is abnormal. You are given Toradol 30 mg.  Continue to take Tylenol today.  You can begin taking ibuprofen on 06/04/2023. Make sure you are drinking plenty of fluids.  Try to drink at least 10-12 8 ounce glasses of water daily while symptoms persist. May apply warm compresses to the affected area to help with pain or discomfort. Please use the urine strainer provided today to strain your urine to determine whether or not you have passed a kidney stone. As discussed, if symptoms do not improve, or pain seems to worsen, please follow-up in the emergency department immediately for further evaluation. Follow-up as needed.

## 2023-06-04 ENCOUNTER — Emergency Department (HOSPITAL_COMMUNITY): Payer: Self-pay

## 2023-06-04 ENCOUNTER — Other Ambulatory Visit: Payer: Self-pay

## 2023-06-04 ENCOUNTER — Emergency Department (HOSPITAL_COMMUNITY)
Admission: EM | Admit: 2023-06-04 | Discharge: 2023-06-05 | Disposition: A | Discharge status: Home / Self Care | Payer: Self-pay | Attending: Emergency Medicine | Admitting: Emergency Medicine

## 2023-06-04 DIAGNOSIS — R000 Tachycardia, unspecified: Secondary | ICD-10-CM | POA: Insufficient documentation

## 2023-06-04 DIAGNOSIS — D72829 Elevated white blood cell count, unspecified: Secondary | ICD-10-CM | POA: Insufficient documentation

## 2023-06-04 DIAGNOSIS — E876 Hypokalemia: Secondary | ICD-10-CM | POA: Insufficient documentation

## 2023-06-04 DIAGNOSIS — N12 Tubulo-interstitial nephritis, not specified as acute or chronic: Secondary | ICD-10-CM | POA: Insufficient documentation

## 2023-06-04 LAB — COMPREHENSIVE METABOLIC PANEL
ALT: 18 U/L (ref 0–44)
AST: 14 U/L — ABNORMAL LOW (ref 15–41)
Albumin: 3.6 g/dL (ref 3.5–5.0)
Alkaline Phosphatase: 65 U/L (ref 38–126)
Anion gap: 11 (ref 5–15)
BUN: 7 mg/dL (ref 6–20)
CO2: 20 mmol/L — ABNORMAL LOW (ref 22–32)
Calcium: 8.4 mg/dL — ABNORMAL LOW (ref 8.9–10.3)
Chloride: 105 mmol/L (ref 98–111)
Creatinine, Ser: 0.74 mg/dL (ref 0.44–1.00)
GFR, Estimated: >60 mL/min (ref >60)
Glucose, Bld: 118 mg/dL — ABNORMAL HIGH (ref 70–99)
Potassium: 3.1 mmol/L — ABNORMAL LOW (ref 3.5–5.1)
Sodium: 136 mmol/L (ref 135–145)
Total Bilirubin: 1.5 mg/dL — ABNORMAL HIGH (ref 0.3–1.2)
Total Protein: 7 g/dL (ref 6.5–8.1)

## 2023-06-04 LAB — CBC
HCT: 37.6 % (ref 36.0–46.0)
Hemoglobin: 13.1 g/dL (ref 12.0–15.0)
MCH: 32.9 pg (ref 26.0–34.0)
MCHC: 34.8 g/dL (ref 30.0–36.0)
MCV: 94.5 fL (ref 80.0–100.0)
Platelets: 227 10*3/uL (ref 150–400)
RBC: 3.98 MIL/uL (ref 3.87–5.11)
RDW: 12.6 % (ref 11.5–15.5)
WBC: 15.9 10*3/uL — ABNORMAL HIGH (ref 4.0–10.5)
nRBC: 0 % (ref 0.0–0.2)

## 2023-06-04 LAB — URINALYSIS, ROUTINE W REFLEX MICROSCOPIC
Bilirubin Urine: NEGATIVE
Glucose, UA: NEGATIVE mg/dL
Ketones, ur: 5 mg/dL — AB
Nitrite: NEGATIVE
Protein, ur: NEGATIVE mg/dL
Specific Gravity, Urine: 1.005 (ref 1.005–1.030)
pH: 5 (ref 5.0–8.0)

## 2023-06-04 LAB — LIPASE, BLOOD: Lipase: 26 U/L (ref 11–51)

## 2023-06-04 MED ORDER — HYDROCODONE-ACETAMINOPHEN 5-325 MG PO TABS: 1.0000 | ORAL_TABLET | Freq: Four times a day (QID) | ORAL | 0 refills | Status: DC | PRN

## 2023-06-04 MED ORDER — KETOROLAC TROMETHAMINE 15 MG/ML IJ SOLN
15.0000 mg | Freq: Once | INTRAMUSCULAR | Status: AC
  Administered 2023-06-04: 15 mg via INTRAVENOUS
  Filled 2023-06-04: qty 1

## 2023-06-04 MED ORDER — LACTATED RINGERS IV BOLUS
1000.0000 mL | Freq: Once | INTRAVENOUS | Status: AC
  Administered 2023-06-04: 1000 mL via INTRAVENOUS

## 2023-06-04 MED ORDER — MORPHINE SULFATE (PF) 4 MG/ML IV SOLN
4.0000 mg | Freq: Once | INTRAVENOUS | Status: AC
  Administered 2023-06-04: 4 mg via INTRAVENOUS
  Filled 2023-06-04: qty 1

## 2023-06-04 MED ORDER — ACETAMINOPHEN 500 MG PO TABS
1000.0000 mg | ORAL_TABLET | Freq: Once | ORAL | Status: AC
  Administered 2023-06-04: 1000 mg via ORAL
  Filled 2023-06-04: qty 2

## 2023-06-04 MED ORDER — CEPHALEXIN 500 MG PO CAPS: 500.0000 mg | ORAL_CAPSULE | Freq: Four times a day (QID) | ORAL | 0 refills | Status: DC

## 2023-06-04 MED ORDER — SODIUM CHLORIDE 0.9 % IV SOLN
1.0000 g | Freq: Once | INTRAVENOUS | Status: AC
  Administered 2023-06-04: 1 g via INTRAVENOUS
  Filled 2023-06-04: qty 10

## 2023-06-04 MED ORDER — ONDANSETRON 4 MG PO TBDP: 4.0000 mg | ORAL_TABLET | Freq: Once | ORAL | Status: DC | PRN

## 2023-06-04 NOTE — Discharge Instructions (Addendum)
In addition to the prescription pain medication you can take 2 ibuprofen (400mg ) every 6 hours for headache and fever.  The antibiotic should start improving your symptoms within the next 2 days.  Make sure you are resting and getting plenty of fluids.  If you are not feeling better after starting the antibiotic or things are getting worse return to the emergency room.

## 2023-06-04 NOTE — ED Triage Notes (Signed)
Pt c/o R sided flank pain and RUQ abdominal pain for two days. Pt endorses nausea without vomiting. No urinary changes.

## 2023-06-04 NOTE — ED Provider Notes (Signed)
[place] EMERGENCY DEPARTMENT AT [facility] Provider Note   CSN: [id number] Arrival date & time: 06/04/23  1338     History  Chief Complaint  Patient presents with   Abdominal Pain   Flank Pain    Ashlee Vincent is a 27 y.o. female.  Patient is a 27 year old female with no significant medical problems who is presenting today with complaints of persistent right-sided abdominal/back pain, fever and chills, poor oral intake and pain when she tries to take a deep breath.  She has had some nausea but denies any vomiting.  No change in bowel movements.  She denies any urinary complaints and reports she has an IUD and has not had a period in quite some time.  She has had no vaginal discharge or issues.  She has not had a cough but does complain of shortness of breath because its painful when she breathes.  Patient was seen in urgent care yesterday given Toradol and had a UA that showed trace leukocytes but no other acute findings.  They were thinking she was either having a kidney stone or a lumbar strain.  However she states symptoms are worsening today so she came here for further evaluation.  The history is provided by the patient.  Abdominal Pain Flank Pain Associated symptoms include abdominal pain.       Home Medications Prior to Admission medications   Medication Sig Start Date End Date Taking? Authorizing Provider  cephALEXin (KEFLEX) 500 MG capsule Take 1 capsule (500 mg total) by mouth 4 (four) times daily. 06/04/23  Yes Gwyneth Sprout, MD  HYDROcodone-acetaminophen (NORCO/VICODIN) 5-325 MG tablet Take 1 tablet by mouth every 6 (six) hours as needed. 06/04/23  Yes Gwyneth Sprout, MD  acetaminophen (TYLENOL) 325 MG tablet Take 650 mg by mouth every 6 (six) hours as needed for mild pain or headache.    [provider]  Blood Pressure Monitor MISC For regular home bp monitoring during pregnancy 06/22/20   Cheral Marker, CNM  calcium carbonate (TUMS -  DOSED IN MG ELEMENTAL CALCIUM) 500 MG chewable tablet Chew 1 tablet by mouth daily as needed for indigestion or heartburn.    [provider]  ibuprofen (ADVIL) 800 MG tablet Take 1 tablet (800 mg total) by mouth every 8 (eight) hours as needed. 06/03/23   Leath-Warren, Sadie Haber, NP  Prenatal Vit-Fe Fumarate-FA (PRENATAL MULTIVITAMIN) TABS tablet Take 1 tablet by mouth daily at 12 noon. 04/20/18   Garnette Gunner, MD  tamsulosin (FLOMAX) 0.4 MG CAPS capsule Take 1 capsule (0.4 mg total) by mouth daily after supper. 06/03/23   Leath-Warren, Sadie Haber, NP      Allergies    Patient has no known allergies.    Review of Systems   Review of Systems  Gastrointestinal:  Positive for abdominal pain.  Genitourinary:  Positive for flank pain.    Physical Exam Updated Vital Signs BP 103/66 (BP Location: Left Arm)   Pulse 93   Temp 99.9 F (37.7 C) (Oral)   Resp 16   LMP  (LMP Unknown) Comment: pt had IUD causing priods to be irregular  SpO2 98%  Physical Exam Vitals and nursing note reviewed.  Constitutional:      General: She is not in acute distress.    Appearance: She is well-developed.     Comments: Appears uncomfortable  HENT:     Head: Normocephalic and atraumatic.  Eyes:     Pupils: Pupils are equal, round, and reactive to  light.  Cardiovascular:     Rate and Rhythm: Regular rhythm. Tachycardia present.     Heart sounds: Normal heart sounds. No murmur heard.    No friction rub.  Pulmonary:     Effort: Pulmonary effort is normal.     Breath sounds: Normal breath sounds. No wheezing or rales.  Abdominal:     General: Bowel sounds are normal. There is no distension.     Palpations: Abdomen is soft.     Tenderness: There is abdominal tenderness in the right upper quadrant. There is right CVA tenderness. There is no guarding or rebound.  Musculoskeletal:        General: No tenderness. Normal range of motion.     Right lower leg: No edema.     Left lower leg: No edema.      Comments: No edema  Skin:    General: Skin is warm and dry.     Findings: No rash.  Neurological:     Mental Status: She is alert and oriented to person, place, and time.     Cranial Nerves: No cranial nerve deficit.  Psychiatric:        Behavior: Behavior normal.     ED Results / Procedures / Treatments   Labs (all labs ordered are listed, but only abnormal results are displayed) Labs Reviewed  COMPREHENSIVE METABOLIC PANEL - Abnormal; Notable for the following components:      Result Value   Potassium 3.1 (*)    CO2 20 (*)    Glucose, Bld 118 (*)    Calcium 8.4 (*)    AST 14 (*)    Total Bilirubin 1.5 (*)    All other components within normal limits  CBC - Abnormal; Notable for the following components:   WBC 15.9 (*)    All other components within normal limits  URINALYSIS, ROUTINE W REFLEX MICROSCOPIC - Abnormal; Notable for the following components:   APPearance HAZY (*)    Hgb urine dipstick MODERATE (*)    Ketones, ur 5 (*)    Leukocytes,Ua MODERATE (*)    Bacteria, UA FEW (*)    All other components within normal limits  URINE CULTURE  LIPASE, BLOOD  HCG, SERUM, QUALITATIVE    EKG None  Radiology DG Chest 2 View  Result Date: 06/04/2023 CLINICAL DATA:  Right chest pain EXAM: CHEST - 2 VIEW COMPARISON:  None Available. FINDINGS: The heart size and mediastinal contours are within normal limits. Both lungs are clear. The visualized skeletal structures are unremarkable. IMPRESSION: No active cardiopulmonary disease. Electronically Signed   By: Helyn Numbers M.D.   On: 06/04/2023 22:34   CT Renal Stone Study  Result Date: 06/04/2023 CLINICAL DATA:  Abdominal pain, flank pain EXAM: CT ABDOMEN AND PELVIS WITHOUT CONTRAST TECHNIQUE: Multidetector CT imaging of the abdomen and pelvis was performed following the standard protocol without IV contrast. RADIATION DOSE REDUCTION: This exam was performed according to the departmental dose-optimization program which  includes automated exposure control, adjustment of the mA and/or kV according to patient size and/or use of iterative reconstruction technique. COMPARISON:  05/17/2017 FINDINGS: Lower chest: Very small portion of lower lung fields are included with no significant abnormality. Hepatobiliary: Liver is not included in its entirety limiting evaluation. There is no dilation of bile ducts. Gallbladder is unremarkable. Pancreas: No focal abnormalities are seen. Spleen: Visualized portions are unremarkable. Adrenals/Urinary Tract: Adrenals are unremarkable. There is no hydronephrosis. There are no renal or ureteral stones. Urinary bladder is not distended.  Stomach/Bowel: Visualized portions of stomach are unremarkable. Upper portions of stomach are not included. Small bowel loops are unremarkable. The appendix is not dilated. There is no significant wall thickening in colon. There is no pericolic stranding. Vascular/Lymphatic: Unremarkable. Reproductive: IUD is seen in uterus. No dominant adnexal masses are seen. Other: There is no ascites or pneumoperitoneum. Umbilical hernia containing fat is seen. Musculoskeletal: No acute findings are seen. IMPRESSION: There is no evidence of intestinal obstruction or pneumoperitoneum. There is no hydronephrosis. Appendix is not dilated. Upper portions of abdomen are not included in their entirety limiting evaluation of portions of the liver, stomach and spleen. Umbilical hernia containing fat is seen. IUD is seen in uterus. Electronically Signed   By: Ernie Avena M.D.   On: 06/04/2023 20:54   US Abdomen Limited RUQ (LIVER/GB)  Result Date: 06/04/2023 CLINICAL DATA:  Right upper quadrant abdominal pain since yesterday. EXAM: ULTRASOUND ABDOMEN LIMITED RIGHT UPPER QUADRANT COMPARISON:  Noncontrast abdominopelvic CT 05/17/2017. FINDINGS: Gallbladder: No gallstones or wall thickening visualized. No sonographic Murphy sign noted by sonographer. Probable small gallbladder polyps  measuring up to 4 mm in diameter. Common bile duct: Diameter: 4 mm.  No intrahepatic biliary dilatation. Liver: No focal lesion identified. Within normal limits in parenchymal echogenicity. Portal vein is patent on color Doppler imaging with normal direction of blood flow towards the liver. Other: None. IMPRESSION: 1. No acute process identified. 2. Probable small gallbladder polyps. Per consensus guidelines, no specific follow-up imaging recommended. This recommendation follows ACR consensus guidelines: White Paper of the ACR Incidental Findings Committee II on Gallbladder and Biliary Findings. J Am Coll Radiol 2013:;10:953-956. Electronically Signed   By: Carey Bullocks M.D.   On: 06/04/2023 16:38    Procedures Procedures    Medications Ordered in ED Medications  ondansetron (ZOFRAN-ODT) disintegrating tablet 4 mg (has no administration in time range)  cefTRIAXone (ROCEPHIN) 1 g in sodium chloride 0.9 % 100 mL IVPB (1 g Intravenous New Bag/Given 06/04/23 2328)  lactated ringers bolus 1,000 mL (0 mLs Intravenous Stopped 06/04/23 2136)  morphine (PF) 4 MG/ML injection 4 mg (4 mg Intravenous Given 06/04/23 2027)  acetaminophen (TYLENOL) tablet 1,000 mg (1,000 mg Oral Given 06/04/23 2025)  ketorolac (TORADOL) 15 MG/ML injection 15 mg (15 mg Intravenous Given 06/04/23 2328)    ED Course/ Medical Decision Making/ A&P                             Medical Decision Making Amount and/or Complexity of Data Reviewed External Data Reviewed: notes. Labs: ordered. Decision-making details documented in ED Course. Radiology: ordered and independent interpretation performed. Decision-making details documented in ED Course.  Risk OTC drugs. Prescription drug management.   Pt  presenting today with a complaint that caries a high risk for morbidity and mortality.  Patient is here with fever, tachycardia and right upper quadrant and flank pain.  Concern for pyelonephritis, cholecystitis, hepatitis.  Lower  suspicion for ectopic pregnancy, TOA, appendicitis.  Patient was initially MSE need in triage and I independently interpreted her labs.  UA today with moderate hemoglobin, moderate leukocytes, 11-20 white cells and few bacteria, lipase within normal limits, CMP with minimal hypokalemia of 3.1 but normal renal function and LFTs, CBC with leukocytosis of 15.9.  I have independently visualized and interpreted pt's images today. Right upper quadrant ultrasound does not show any gallbladder wall thickening or stones.  Radiology reports no acute process identified other than probable small gallbladder polyp.  Given patient's symptoms we will do a renal CT for further evaluation.  She was given pain control and IV fluids.  As well as antipyretics.  11:37 PM CXR without evidence of pneumonia.  CT renal without evidence of stone or abnormalities per radiology.  No evidence of appendicitis or other acute intestinal abnormality.  Findings discussed with the patient.  At this time given her flank pain and fever with urine findings will treat with Rocephin and sent home with Keflex.  She was also given pain control to use at home as well as return precautions.  Cultures are pending.  She is tolerating p.o.'s and at this time does not require admission.         Final Clinical Impression(s) / ED Diagnoses Final diagnoses:  Pyelonephritis    Rx / DC Orders ED Discharge Orders          Ordered    HYDROcodone-acetaminophen (NORCO/VICODIN) 5-325 MG tablet  Every 6 hours PRN        06/04/23 2337    cephALEXin (KEFLEX) 500 MG capsule  4 times daily        06/04/23 2337              Gwyneth Sprout, MD 06/04/23 2337

## 2023-06-04 NOTE — ED Provider Triage Note (Signed)
Emergency Medicine Provider Triage Evaluation Note  Ashlee Vincent , a 27 y.o. female  was evaluated in triage.  Pt complains of right lower back pain and right upper quadrant pain that has been ongoing for 2 days.  Pain is constant.  No diarrhea, hematuria, dysuria.  No fevers or chills at home.  No previous abdominal surgeries.  No shortness of breath or chest pain.   Review of Systems  Positive: As above Negative: As above  Physical Exam  BP 118/76 (BP Location: Right Wrist)   Pulse 86   Temp 97.9 F (36.6 C) (Oral)   Resp 17   LMP  (LMP Unknown) Comment: pt had IUD causing priods to be irregular  SpO2 99%  Gen:   Awake, uncomfortable appearing Resp:  Normal effort  MSK:   Moves extremities without difficulty  Other:  Right upper quadrant tenderness, positive Murphy sign No CVA tenderness bilaterally  Medical Decision Making  Medically screening exam initiated at 3:03 PM.  Appropriate orders placed.  Ashlee Vincent was informed that the remainder of the evaluation will be completed by another provider, this initial triage assessment does not replace that evaluation, and the importance of remaining in the ED until their evaluation is complete.     Arabella Merles, PA-C 06/04/23 1511

## 2023-06-06 LAB — URINE CULTURE: Culture: >=100000 — AB

## 2023-12-01 ENCOUNTER — Ambulatory Visit (INDEPENDENT_AMBULATORY_CARE_PROVIDER_SITE_OTHER): Payer: Self-pay | Admitting: *Deleted

## 2023-12-01 VITALS — BP 114/70 | HR 78 | Ht 60.0 in | Wt 191.0 lb

## 2023-12-01 DIAGNOSIS — Z3200 Encounter for pregnancy test, result unknown: Secondary | ICD-10-CM

## 2023-12-01 DIAGNOSIS — N939 Abnormal uterine and vaginal bleeding, unspecified: Secondary | ICD-10-CM

## 2023-12-01 LAB — POCT URINE PREGNANCY: Preg Test, Ur: NEGATIVE

## 2023-12-01 NOTE — Progress Notes (Signed)
   NURSE VISIT- PREGNANCY CONFIRMATION   SUBJECTIVE:  Ashlee Vincent is a 28 y.o. [phone] female by certain LMP of Patient's last menstrual period was 11/07/2023. Here for pregnancy confirmation.  Home pregnancy test: positive x 2   She reports bleeding with small clots and cramping that started on 11/27/23 and has since been spotting daily. Has had an IUD since 2022 and has not been able to feel her strings. States she has not been having a period with the IUD until 2 months ago.  She is not taking prenatal vitamins.    OBJECTIVE:  BP 114/70 (BP Location: Right Arm, Patient Position: Sitting, Cuff Size: Large)   Pulse 78   Ht 5' (1.524 m)   Wt 191 lb (86.6 kg)   LMP 11/07/2023   BMI 37.30 kg/m   Appears well, in no apparent distress  Results for orders placed or performed in visit on 12/01/23 (from the past 24 hours)  POCT urine pregnancy   Collection Time: 12/01/23  9:00 AM  Result Value Ref Range   Preg Test, Ur Negative Negative    ASSESSMENT: Negative pregnancy test    PLAN: HCG today Schedule for dating ultrasound in TBD  Advised if HCG negative, should make appt for IUD check Prenatal vitamins:  Will begin later if HCG positive    Nausea medicines: not currently needed   OB packet given: No  Safiyya Stokes  12/01/2023 9:02 AM

## 2023-12-02 LAB — BETA HCG QUANT (REF LAB): hCG Quant: <1 m[IU]/mL

## 2024-02-13 ENCOUNTER — Other Ambulatory Visit: Payer: Self-pay

## 2024-02-13 ENCOUNTER — Ambulatory Visit
Admission: EM | Admit: 2024-02-13 | Discharge: 2024-02-13 | Disposition: A | Discharge status: Home / Self Care | Payer: Self-pay

## 2024-02-13 ENCOUNTER — Encounter: Payer: Self-pay | Admitting: Emergency Medicine

## 2024-02-13 DIAGNOSIS — H938X1 Other specified disorders of right ear: Secondary | ICD-10-CM

## 2024-02-13 NOTE — Discharge Instructions (Addendum)
 Your ear exam today looks great.  There is no ear wax, foreign body, ear infection, or significant fluid behind your ear that I can see.  You can try Flonase nasal spray to see if this helps with symptoms.  Avoid using Qtips in the future to prevent ear trauma.

## 2024-02-13 NOTE — ED Provider Notes (Signed)
 [facility]    CSN: [id number] Arrival date & time: 02/13/24  4098      History   Chief Complaint Chief Complaint  Patient presents with   Ear Fullness    HPI Ashlee Vincent is a 28 y.o. female.   Patient presents today with 2-day history of right ear fullness.  She denies ear pain, but reports she has muffled hearing out of the right ear.  No known injury to the ear.  Reports she used over-the-counter eardrops and it burned a little bit but did not help with the fullness.  No drainage from the ear.  No recent cough, congestion, or sore throat.  No runny or stuffy nose, sneezing, ear drainage.  She does use Q-tips regularly.    Past Medical History:  Diagnosis Date   Medical history non-contributory     There are no active problems to display for this patient.   Past Surgical History:  Procedure Laterality Date   NO PAST SURGERIES      OB History     Gravida  6   Para  4   Term  4   Preterm  0   AB  2   Living  4      SAB  2   IAB  0   Ectopic  0   Multiple  0   Live Births  4            Home Medications    Prior to Admission medications   Not on File    Family History Family History  Problem Relation Age of Onset   Diabetes Maternal Grandmother    Diabetes Paternal Grandmother     Social History Social History   Tobacco Use   Smoking status: Never   Smokeless tobacco: Never  Vaping Use   Vaping status: Never Used  Substance Use Topics   Alcohol use: No   Drug use: No     Allergies   Patient has no known allergies.   Review of Systems Review of Systems Per HPI  Physical Exam Triage Vital Signs ED Triage Vitals  Encounter Vitals Group     BP 02/13/24 1016 106/72     Systolic BP Percentile --      Diastolic BP Percentile --      Pulse Rate 02/13/24 1016 88     Resp 02/13/24 1016 20     Temp 02/13/24 1016 98 F (36.7 C)     Temp Source 02/13/24 1016 Oral     SpO2 02/13/24 1016 95 %     Weight  --      Height --      Head Circumference --      Peak Flow --      Pain Score 02/13/24 1014 0     Pain Loc --      Pain Education --      Exclude from Growth Chart --    No data found.  Updated Vital Signs BP 106/72 (BP Location: Right Arm)   Pulse 88   Temp 98 F (36.7 C) (Oral)   Resp 20   LMP 01/23/2024 (Approximate)   SpO2 95%   Visual Acuity Right Eye Distance:   Left Eye Distance:   Bilateral Distance:    Right Eye Near:   Left Eye Near:    Bilateral Near:     Physical Exam Vitals and nursing note reviewed.  Constitutional:      General: She is not  in acute distress.    Appearance: Normal appearance. She is not toxic-appearing.  HENT:     Head: Normocephalic and atraumatic.     Right Ear: Tympanic membrane, ear canal and external ear normal.     Left Ear: Tympanic membrane, ear canal and external ear normal.     Nose: Nose normal. No congestion or rhinorrhea.     Mouth/Throat:     Mouth: Mucous membranes are moist.     Pharynx: Oropharynx is clear.  Cardiovascular:     Rate and Rhythm: Normal rate.  Pulmonary:     Effort: Pulmonary effort is normal. No respiratory distress.  Musculoskeletal:     Cervical back: Normal range of motion.  Lymphadenopathy:     Cervical: No cervical adenopathy.  Skin:    General: Skin is warm and dry.     Capillary Refill: Capillary refill takes less than 2 seconds.     Coloration: Skin is not jaundiced or pale.     Findings: No erythema.  Neurological:     Mental Status: She is alert and oriented to person, place, and time.  Psychiatric:        Behavior: Behavior is cooperative.      UC Treatments / Results  Labs (all labs ordered are listed, but only abnormal results are displayed) Labs Reviewed - No data to display  EKG   Radiology No results found.  Procedures Procedures (including critical care time)  Medications Ordered in UC Medications - No data to display  Initial Impression / Assessment and  Plan / UC Course  I have reviewed the triage vital signs and the nursing notes.  Pertinent labs & imaging results that were available during my care of the patient were reviewed by me and considered in my medical decision making (see chart for details).   Patient is well-appearing, normotensive, afebrile, not tachycardic, not tachypneic, oxygenating well on room air.    1. Pressure sensation in right ear  Examination is reassuring today; no red flags on exam or in history Recommended discontinuing use of Q-tips and recommended Flonase nasal spray as needed Return/ER precautions discussed  The patient was given the opportunity to ask questions.  All questions answered to their satisfaction.  The patient is in agreement to this plan.   Final Clinical Impressions(s) / UC Diagnoses   Final diagnoses:  Pressure sensation in right ear     Discharge Instructions      Your ear exam today looks great.  There is no ear wax, foreign body, ear infection, or significant fluid behind your ear that I can see.  You can try Flonase nasal spray to see if this helps with symptoms.  Avoid using Qtips in the future to prevent ear trauma.    ED Prescriptions   None    PDMP not reviewed this encounter.   Valentino Nose, NP 02/13/24 1141

## 2024-02-13 NOTE — ED Triage Notes (Signed)
 Pt reports right ear fullness. Denies pain or injury. Reports used otc drops and denies any improvement in pain.

## 2024-07-26 LAB — GLUCOSE, POCT (MANUAL RESULT ENTRY): POC Glucose: 99 mg/dL (ref 70–99)

## 2024-07-26 NOTE — Congregational Nurse Program (Signed)
 Pt was followed up with a health interview/assessment upon completing their enrollment into the [facility] Uninsured Program on today (07/26/24) by way Maricarmen G (Care Guide)  Pt states they have not seen a PCP  since 2022, but has seen OB/GYN per birthing of her last child 04/2021.     She denies any current medical concerns and no use of any prescribed medications; admits she currently vapes (3-4 x week)   Chief Reasons Needed for PCP - Wellness check - Pelvic exam r/t to evaluation of Mirena IUD   Past /Current Medical History (self-reported by patient) Denies any outside of child birthing   Past/Current Medication History (self-reported by patient) She denies any current medical concerns and no use of any prescribed medications  Socio-determinants health needs identified: Denies SODH needs during visit, but was introduced to onsite food market at [employer] to tour if needed for future access.   In addition pt denies any other sociodeterminant of health needs related to food, housing, safety and transportation.  PHQ-9 Score= 17 (HIGH)  Reviewed with pt and she admits the score identified relates to relationship challenges with former partner/children father, whom she describes as toxic, in addition she states she is not enjoying her job although it helps to support her family.  She states she sometimes feel depressed and down with low energy and appetiite changes, feeling bad about self moving or speaking so slowly that other people could notice.  Denies Suicide/Homicide ideations.      Pt states she would be willing to seek some therapy afforded opportunity.     Completed During today's Visit -Conducted health screenings for diabetes and hypertension along with basic vital signs during visit (Both Screenings were WNL)  -Reviewed Get Care Now steps and facilities to utilize when non-emergent vs emergent conditions arise   -Scheduled Appointment for first medical visit scheduled  for [facility] on Wednesday,  August 04, 2024 @ 8:45am at the [facility]   Plans  -Continous nurse case management upon completion of first medical appointment and thereafter will be maintained with patient while pt is enrolled into Care Connect program  -Advised pt would make a referral to her PCP at [facility] to assist with setting up therapeutic services

## 2024-08-04 ENCOUNTER — Ambulatory Visit: Payer: Self-pay | Admitting: Physician Assistant

## 2024-08-10 ENCOUNTER — Other Ambulatory Visit (HOSPITAL_COMMUNITY)
Admission: RE | Admit: 2024-08-10 | Discharge: 2024-08-10 | Disposition: A | Discharge status: Home / Self Care | Payer: Self-pay | Source: Ambulatory Visit | Attending: Physician Assistant | Admitting: Physician Assistant

## 2024-08-10 ENCOUNTER — Ambulatory Visit: Payer: Self-pay | Admitting: Physician Assistant

## 2024-08-10 ENCOUNTER — Encounter: Payer: Self-pay | Admitting: Physician Assistant

## 2024-08-10 VITALS — BP 117/83 | HR 91 | Temp 97.7°F | Ht 59.5 in | Wt 193.2 lb

## 2024-08-10 DIAGNOSIS — N926 Irregular menstruation, unspecified: Secondary | ICD-10-CM | POA: Insufficient documentation

## 2024-08-10 DIAGNOSIS — T8332XA Displacement of intrauterine contraceptive device, initial encounter: Secondary | ICD-10-CM

## 2024-08-10 DIAGNOSIS — Z7689 Persons encountering health services in other specified circumstances: Secondary | ICD-10-CM

## 2024-08-10 DIAGNOSIS — J069 Acute upper respiratory infection, unspecified: Secondary | ICD-10-CM

## 2024-08-10 LAB — POCT URINE PREGNANCY: Preg Test, Ur: NEGATIVE

## 2024-08-10 NOTE — Progress Notes (Unsigned)
 BP 117/83   Pulse 91   Temp 97.7 F (36.5 C)   Ht 4' 11.5 (1.511 m)   Wt 193 lb 4 oz (87.7 kg)   SpO2 98%   BMI 38.38 kg/m    Subjective:    Patient ID: Ashlee Vincent, female    DOB: 1996-03-12, 28 y.o.   MRN: [id number]  HPI: Ashlee Vincent is a 28 y.o. female presenting on 08/10/2024 for New Patient (Initial Visit) (Previous pt with [facility], pt was last seen there around 2022-2023. Pt states she feels it hard to get in there when they need to.  Pt states her last PAP was around that time. Pt states she was COVID vaccined 3x around 2020. Pt states she has not gotten the flu shot yet and is interested in getting it.)   HPI  Chief Complaint  Patient presents with   New Patient (Initial Visit)    Previous pt with [facility], pt was last seen there around 2022-2023. Pt states she feels it hard to get in there when they need to.  Pt states her last PAP was around that time. Pt states she was COVID vaccined 3x around 2020. Pt states she has not gotten the flu shot yet and is interested in getting it.    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 She Feels like pregnant- had episode Friday.   She is Not using condoms- she hasn't felt IUD strings in a long time- she didn't go back to gyn as recomended at january appt She Has 4 children and doesn't want more She thinks she is pregnant again because she had episode on Friday and it felt like when she was pregnant in the past Her Period 10 days late. She Had implanon years ago She had IUD inserted 2022 she says she was Not good with the pill (missing doses etc( She Had injection only once-no problem with it. She Did preg test at home on Friday that was positive She has a cough and some congestion that started about five days ago.   Relevant past medical, surgical, family and social history reviewed and updated as indicated. Interim medical history since our last visit reviewed. Allergies and medications reviewed and updated.  No current  outpatient medications on file.    Review of Systems  Per HPI unless specifically indicated above     Objective:    BP 117/83   Pulse 91   Temp 97.7 F (36.5 C)   Ht 4' 11.5 (1.511 m)   Wt 193 lb 4 oz (87.7 kg)   SpO2 98%   BMI 38.38 kg/m   Wt Readings from Last 3 Encounters:  08/10/24 193 lb 4 oz (87.7 kg)  07/26/24 190 lb (86.2 kg)  12/01/23 191 lb (86.6 kg)    Physical Exam Vitals reviewed.  Constitutional:      General: She is not in acute distress.    Appearance: She is well-developed. She is not toxic-appearing.  HENT:     Head: Normocephalic and atraumatic.     Nose: Congestion and rhinorrhea present.  Eyes:     Extraocular Movements: Extraocular movements intact.     Conjunctiva/sclera: Conjunctivae normal.     Pupils: Pupils are equal, round, and reactive to light.  Cardiovascular:     Rate and Rhythm: Normal rate and regular rhythm.  Pulmonary:     Effort: Pulmonary effort is normal.     Breath sounds: Normal breath sounds.  Abdominal:     General: Bowel  sounds are normal.     Palpations: Abdomen is soft. There is no mass.     Tenderness: There is no abdominal tenderness.  Musculoskeletal:     Cervical back: Neck supple.  Lymphadenopathy:     Cervical: No cervical adenopathy.  Skin:    General: Skin is warm and dry.  Neurological:     Mental Status: She is alert and oriented to person, place, and time.  Psychiatric:        Behavior: Behavior normal.     Upreg- negative      Assessment & Plan:   Encounter Diagnoses  Name Primary?   Encounter to establish care Yes   Menstrual period late    Upper respiratory tract infection, unspecified type    Intrauterine contraceptive device threads lost, initial encounter      -Pt was given Flu shot voucher -will Plan depo shot today or later this week after confirmation negative pregnancy -IUD seen on CT 06/04/2023.  Will obtain US  to check.  If present, will sent to gyn -will get last PAP  record from [facility]

## 2024-08-10 NOTE — Patient Instructions (Signed)
 Medroxyprogesterone  Injection (Contraception) Qu es este medicamento? La MEDROXIPROGESTERONA evita la ovulacin y el embarazo. Pertenece a un grupo de medicamentos llamados anticonceptivos. Este medicamento es una hormona del grupo de los progestgenos. Este medicamento puede ser utilizado para otros usos; si tiene alguna pregunta consulte con su proveedor de atencin mdica o con su farmacutico. MARCAS COMUNES: Depo-Provera , Depo-subQ Provera  104 Qu le debo informar a mi profesional de la salud antes de tomar este medicamento? Necesitan saber si usted presenta alguno de los siguientes problemas o situaciones: Asma Cogulos sanguneos Cncer de mama o antecedentes familiares de cncer de mama Depresin Diabetes Trastorno de la alimentacin (anorexia nerviosa) Consume bebidas alcohlicas con frecuencia Ataque cardiaco Presin arterial alta Infeccin por VIH o SIDA Enfermedad renal Enfermedad heptica Migraas Osteoporosis, huesos dbiles Convulsiones Accidente cerebrovascular Uso de tabaco Sangrado vaginal Una reaccin alrgica o inusual a la medroxiprogesterona, a otros medicamentos, alimentos, colorantes o conservantes Si est embarazada o buscando quedar embarazada Si est amamantando a un beb Cmo debo utilizar este medicamento? Depo-Provera  CI inyeccin anticonceptiva se administra en un msculo. Depo-subQ Provera  104 inyectable se administra por va subcutnea. Se administra en un hospital o en un entorno clnico. Esta inyeccin generalmente se aplica durante los primeros 5 das del inicio de un periodo menstrual o 6 semanas despus de haber tenido un beb. Recibir un folleto de informacin para el paciente con cada receta y en cada ocasin que la vuelva a surtir. Asegrese de leer esta informacin cada vez cuidadosamente. El folleto puede cambiar frecuentemente. Hable con su equipo de atencin sobre el uso de este medicamento en nios. Puede requerir atencin especial.  Estas inyecciones han sido usadas en nias que han empezado a tener perodos [place]. Sobredosis: Pngase en contacto inmediatamente con un centro toxicolgico o una sala de urgencia si usted cree que haya tomado demasiado medicamento.<br>ATENCIN: [employer] es solo para usted. No comparta este medicamento con nadie. Qu sucede si me olvido de una dosis? Cumpla con las citas para dosis de seguimiento. Debe recibir ignacia inyeccin una vez cada 3 meses. Es importante no olvidar ninguna dosis. Llame a su equipo de atencin si no puede asistir a una cita. Qu puede interactuar con este medicamento? Antibiticos o medicamentos para inyecciones, especialmente rifampicina y [address] antivirales para VIH o hepatitis Aprepitant Armodafinilo [place] Medicamentos para convulsiones, tales como [place], felbamato, oxcarbazepina, fenitona, fenobarbital, primidona, topiramato [place] Modafinilo Hierba de [address] ser que esta lista no menciona todas las posibles interacciones. Informe a su profesional de [employer] de [employer] productos a base de hierbas, medicamentos de [place] o suplementos nutritivos que est tomando. Si usted fuma, consume bebidas alcohlicas o si utiliza drogas ilegales, indqueselo tambin a su profesional de [employer]. Algunas sustancias pueden interactuar con su medicamento. A qu debo estar atento al usar [employer]? Este medicamento no la protege de la infeccin por VIH (SIDA) ni de ninguna otra enfermedad de transmisin sexual. Usar este producto puede causarle prdida de calcio de los huesos. La prdida de calcio puede causar debilidad sea (osteoporosis). Solamente use este producto por ms de 2 aos si no hay otro mtodo anticonceptivo que sea adecuado para usted. Cuanto ms tiempo use este producto como anticonceptivo, ms posibilidades tendr de [employer] de debilitar sus huesos. Consulte a su equipo de  atencin sobre cmo Pharmacologist fuertes sus [employer]. Es posible que tenga un cambio en el patrn de sangrado o perodos menstruales irregulares. Muchas mujeres dejan de tener perodos [employer] toman  este medicamento. Si recibi sus inyecciones a tiempo, sus probabilidades de estar embarazada son theophilus craw. Si cree que podra estar embarazada, consulte a su equipo de atencin tan pronto como sea posible. Informe a su equipo de atencin si desea quedar embarazada en el prximo ao. [place] de este medicamento puede durar mucho tiempo despus de recibir la ltima inyeccin. Qu efectos secundarios puedo tener al [employer] este medicamento? Efectos secundarios que debe informar a su equipo de atencin tan pronto como sea posible: Reacciones alrgicas: erupcin cutnea, comezn/picazn, urticaria, hinchazn de la cara, los labios, la lengua o la garganta Cogulo sanguneo: Engineer, mining, hinchazn, calor en una pierna, falta de aire, dolor en el pecho Problemas en la vescula biliar: dolor de estmago intenso, nuseas, vmitos, fiebre Aumento de la presin arterial Lesin en el hgado: dolor en la regin abdominal superior derecha, prdida de apetito, nuseas, heces de color claro, orina amarilla oscura o marrn, color amarillento de los ojos o la piel, debilidad o fatiga inusuales Migraas o dolores de cabeza nuevos o peores Convulsiones Accidente cerebrovascular: entumecimiento o debilidad repentinos de la cara, un brazo o una pierna, dificultad para hablar, confusin, dificultad para caminar, prdida de equilibrio o coordinacin, mareos, dolor de cabeza intenso, cambio en la visin Flujo vaginal inusual, comezn/picazn u olor Empeoramiento del estado de nimo, sentimientos de depresin Efectos secundarios que generalmente no requieren atencin mdica (debe informarlos a su equipo de atencin si persisten o si son molestos): Engineer, mining o sensibilidad de las Designer, fashion/clothing oscuras de la piel en la cara u  otras reas expuestas al sol Ciclos menstruales irregulares o sangrado ligero entre periodos menstruales Nuseas Aumento de peso Puede ser que esta lista no menciona todos los posibles efectos secundarios. Comunquese a su mdico por asesoramiento mdico [employer]. Usted puede informar los efectos secundarios a la FDA por telfono al 1-800-FDA-1088. Dnde debo guardar mi medicina? Solamente un equipo de atencin puede administrar esta inyeccin. No se guarda en su casa. ATENCIN: Este folleto es un resumen. Puede ser que no cubra toda la posible informacin. Si usted tiene preguntas acerca de esta medicina, consulte con su mdico, su farmacutico o su profesional de Radiographer, therapeutic.  2024 Elsevier/Gold Standard (2022-09-25 00:00:00)

## 2024-08-11 ENCOUNTER — Ambulatory Visit: Payer: Self-pay | Admitting: Physician Assistant

## 2024-08-11 LAB — BETA HCG QUANT (REF LAB): hCG Quant: <1 m[IU]/mL

## 2024-08-18 ENCOUNTER — Encounter: Payer: Self-pay | Admitting: Physician Assistant

## 2024-08-18 ENCOUNTER — Ambulatory Visit: Payer: Self-pay | Admitting: Physician Assistant

## 2024-08-18 VITALS — BP 93/58 | HR 80 | Temp 97.2°F | Ht 59.5 in | Wt 189.0 lb

## 2024-08-18 DIAGNOSIS — Z30013 Encounter for initial prescription of injectable contraceptive: Secondary | ICD-10-CM

## 2024-08-18 DIAGNOSIS — R42 Dizziness and giddiness: Secondary | ICD-10-CM

## 2024-08-18 LAB — POCT GLUCOSE (DEVICE FOR HOME USE): POC Glucose: 97 mg/dL (ref 70–99)

## 2024-08-18 LAB — POCT PREGNANCY, URINE

## 2024-08-18 LAB — POCT URINE PREGNANCY: Preg Test, Ur: NEGATIVE

## 2024-08-18 MED ORDER — MEDROXYPROGESTERONE ACETATE 150 MG/ML IM SUSP: 150.0000 mg | INTRAMUSCULAR | Status: DC

## 2024-08-18 MED ORDER — MEDROXYPROGESTERONE ACETATE 150 MG/ML IM SUSP
150.0000 mg | Freq: Once | INTRAMUSCULAR | Status: AC
Start: 2024-08-18 — End: 2024-08-18
  Administered 2024-08-18: 150 mg via INTRAMUSCULAR

## 2024-08-18 NOTE — Patient Instructions (Addendum)
 Vasovagal Syncope  Vasovagal syncope, commonly known as a vagal episode, is a sudden loss of consciousness caused by a drop in heart rate and blood pressure.  Causes:  Overstimulation of the vagus nerve, which controls heart rate and blood pressure  Triggers can include: Standing for prolonged periods  Seeing blood or needles  Intense pain  Emotional stress  Dehydration  Symptoms:  dizziness, lightheadedness, nausea, sweating, blurred vision, weakness, and loss of consciousness.  Treatment:  Lie down or sit down quickly to restore blood flow to the brain. If conscious, raise legs to improve blood return. If unconscious, call for emergency medical assistance. Once conscious, avoid standing up too quickly. Drink plenty of fluids.  Prevention:  Avoid triggers, Exercise regularly to improve blood flow, Stay hydrated,

## 2024-08-18 NOTE — Progress Notes (Signed)
 BP (!) 93/58   Pulse 80   Temp (!) 97.2 F (36.2 C)   Ht 4' 11.5 (1.511 m)   Wt 189 lb (85.7 kg)   SpO2 98%   BMI 37.53 kg/m    Subjective:    Patient ID: Ashlee Vincent, female    DOB: 11/14/1996, 28 y.o.   MRN: [id number]  HPI: Ashlee Vincent is a 28 y.o. female presenting on 08/18/2024 for Dizziness (Pt states since Friday 08-10-24 she has been feeling lightheaded about 3 times within the week and has fallen over twice, pt states on Sunday she LOC for a few seconds. Pt states she feels as if the room is spinning and is light sensitive.)   HPI  Chief Complaint  Patient presents with   Dizziness    Pt states since Friday 08-10-24 she has been feeling lightheaded about 3 times within the week and has fallen over twice, pt states on Sunday she LOC for a few seconds. Pt states she feels as if the room is spinning and is light sensitive.    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    Her Episodes of dizzy lasts only a few minutes. It only happens once/day. It Usually happens in the afternoon.  She says that when these episodes come are at times of heightened stress.  Pt has a lot of issues with stress.  She is an unmarried mother of four children.   She Denies HA.  She has No fevers or EA.   No ST or nasal cong.   Relevant past medical, surgical, family and social history reviewed and updated as indicated. Interim medical history since our last visit reviewed. Allergies and medications reviewed and updated.  CURRENT MEDS: none  Review of Systems  Per HPI unless specifically indicated above     Objective:    BP (!) 93/58   Pulse 80   Temp (!) 97.2 F (36.2 C)   Ht 4' 11.5 (1.511 m)   Wt 189 lb (85.7 kg)   SpO2 98%   BMI 37.53 kg/m   Wt Readings from Last 3 Encounters:  08/18/24 189 lb (85.7 kg)  08/10/24 193 lb 4 oz (87.7 kg)  07/26/24 190 lb (86.2 kg)    Physical Exam Vitals reviewed.   Constitutional:      General: She is not in acute distress.    Appearance: She is well-developed. She is not ill-appearing or toxic-appearing.  HENT:     Head: Normocephalic and atraumatic.     Right Ear: Tympanic membrane, ear canal and external ear normal.     Left Ear: Tympanic membrane, ear canal and external ear normal.  Eyes:     Extraocular Movements: Extraocular movements intact.     Conjunctiva/sclera: Conjunctivae normal.     Pupils: Pupils are equal, round, and reactive to light.  Cardiovascular:     Rate and Rhythm: Normal rate and regular rhythm.     Pulses:          Radial pulses are 2+ on the right side and 2+ on the left side.  Pulmonary:     Effort: Pulmonary effort is normal.     Breath sounds: Normal breath sounds.  Abdominal:     General: Bowel sounds are normal.     Palpations: Abdomen is soft. There is no mass.     Tenderness: There is no abdominal tenderness.  Musculoskeletal:     Cervical  back: Neck supple.     Right lower leg: No edema.     Left lower leg: No edema.  Lymphadenopathy:     Cervical: No cervical adenopathy.  Skin:    General: Skin is warm and dry.  Neurological:     Mental Status: She is alert and oriented to person, place, and time.     Cranial Nerves: No facial asymmetry.     Motor: No weakness or tremor.     Coordination: Romberg sign negative.     Gait: Gait is intact. Gait normal.     Deep Tendon Reflexes:     Reflex Scores:      Patellar reflexes are 2+ on the right side and 2+ on the left side. Psychiatric:        Attention and Perception: Attention normal.        Speech: Speech normal.        Behavior: Behavior normal. Behavior is cooperative.       Results for orders placed or performed in visit on 08/18/24  POCT Pregnancy, Urine   Collection Time: 08/18/24  8:22 AM  Result Value Ref Range   Negative     FBS 97      Assessment & Plan:   Encounter Diagnoses  Name Primary?   Encounter for initial  prescription of injectable contraceptive Yes   Light-headed feeling       -Pt is given depo-provera by nurse Audrene.  She will RTO in 12 weeks for next dose -discussed with pt likely vasovagal episodes.  Encouraged her to be aware when she is getting into stressful times and change her circumstances.  She has appointment with [facility] and she is encouraged to continue to work with stress issues.  Will check cbc and bmp today.   -pt is scheduled for US  to check IUD status -pt is to contact office if she continues with these episodes or if she has new symptoms or for worsening

## 2024-08-20 ENCOUNTER — Ambulatory Visit (HOSPITAL_COMMUNITY): Payer: Self-pay

## 2024-09-02 ENCOUNTER — Ambulatory Visit (HOSPITAL_COMMUNITY): Payer: Self-pay

## 2024-09-07 ENCOUNTER — Ambulatory Visit: Payer: Self-pay

## 2024-09-07 DIAGNOSIS — F4323 Adjustment disorder with mixed anxiety and depressed mood: Secondary | ICD-10-CM

## 2024-09-10 ENCOUNTER — Ambulatory Visit (HOSPITAL_COMMUNITY)
Admission: RE | Admit: 2024-09-10 | Discharge: 2024-09-10 | Disposition: A | Discharge status: Home / Self Care | Payer: Self-pay | Source: Ambulatory Visit | Attending: Physician Assistant | Admitting: Physician Assistant

## 2024-09-10 ENCOUNTER — Other Ambulatory Visit (HOSPITAL_COMMUNITY)
Admission: RE | Admit: 2024-09-10 | Discharge: 2024-09-10 | Disposition: A | Discharge status: Home / Self Care | Payer: Self-pay | Source: Ambulatory Visit | Attending: Physician Assistant | Admitting: Physician Assistant

## 2024-09-10 DIAGNOSIS — R42 Dizziness and giddiness: Secondary | ICD-10-CM | POA: Insufficient documentation

## 2024-09-10 DIAGNOSIS — T8332XA Displacement of intrauterine contraceptive device, initial encounter: Secondary | ICD-10-CM | POA: Insufficient documentation

## 2024-09-10 LAB — CBC
HCT: 42.9 % (ref 36.0–46.0)
Hemoglobin: 14.9 g/dL (ref 12.0–15.0)
MCH: 33.1 pg (ref 26.0–34.0)
MCHC: 34.7 g/dL (ref 30.0–36.0)
MCV: 95.3 fL (ref 80.0–100.0)
Platelets: 268 K/uL (ref 150–400)
RBC: 4.5 MIL/uL (ref 3.87–5.11)
RDW: 12.5 % (ref 11.5–15.5)
WBC: 6.7 K/uL (ref 4.0–10.5)
nRBC: 0 % (ref 0.0–0.2)

## 2024-09-10 LAB — BASIC METABOLIC PANEL WITH GFR
Anion gap: 9 (ref 5–15)
BUN: 10 mg/dL (ref 6–20)
CO2: 25 mmol/L (ref 22–32)
Calcium: 8.7 mg/dL — ABNORMAL LOW (ref 8.9–10.3)
Chloride: 107 mmol/L (ref 98–111)
Creatinine, Ser: 0.65 mg/dL (ref 0.44–1.00)
GFR, Estimated: >60 mL/min (ref >60)
Glucose, Bld: 97 mg/dL (ref 70–99)
Potassium: 4.3 mmol/L (ref 3.5–5.1)
Sodium: 141 mmol/L (ref 135–145)

## 2024-09-13 ENCOUNTER — Ambulatory Visit: Payer: Self-pay | Admitting: Physician Assistant

## 2024-09-13 NOTE — Progress Notes (Signed)
 This is session #1 with Ashlee Vincent for individual behavioral health services for anxiety and depression. Patient presents alone. BHP spent 50 minutes with patient.    Patient reports anxious symptoms including racing thoughts, autonomic hyperactivity, and feelings of overwhelm. Pt names depressive symptoms including hopelessness and low self-worth. Symptoms are related to environmental barriers and interpersonal stressors. Patient will likely benefit from individual services with strengths-based intervention and coping skill development with MI to aid decision-making.   This procedure has been fully reviewed with the patient and informed consent has been obtained.   Patient location: Pt seen in clinic via telehealth.   I connected with  Ashlee Vincent on 09/07/24 by a video enabled telemedicine application and verified that I am speaking with the correct person.   I discussed the limitations of evaluation and management by telemedicine. The patient expressed understanding and agreed to proceed.     ASSESSMENT AND PLAN   Current diagnosis:  adjustment reaction with anxiety and depression   We created the following treatment plan at today's appointment:               1)  09/07/24               2)  Decrease anxious and depressive symptoms through self awareness, coping skills, self-care, and supports, and increase overall level of life satisfaction and well-being.                3)  Medication management with Provider.               4)  Next screening due in 6 sessions.     HISTORY OF PRESENT ILLNESS   Mental Status: Ashlee Vincent presented oriented X4. Mood and affect were anxious and depressed. Judgment was appropriate. Memory was appropriate. Thought processes and content were appropriate. Patient denies SI/HI.     Narrative: Met with pt for scheduled appointment. Informed consent obtained verbally. Person-centered approach used to begin therapeutic alliance. Pt describes presenting  concerns as anxious and depressive symptoms tied to interpersonal and environmental stressors that increased due to job ending. Explored concerns, contributing factors, hx, supports, and goals. Provided support to consider changes, noting cognitions/emotions, and limited change talk. Created plan to incorporate coping strategies such as deep breathing, mantras, and positive self-care. Pt names scratching behavior for which substitutes are suggested (including snapping rubber band, holding ice, and squeezing stress ball). Assessed for additional concerns pt wanted to address which they denied. Scheduled follow up.   Effectiveness of the intervention(s) and the beneficiary's response or progress toward goal(s):  Patient is in a contemplative stage of change to engage in treatment plan

## 2024-09-28 ENCOUNTER — Ambulatory Visit: Payer: Self-pay

## 2024-10-05 ENCOUNTER — Ambulatory Visit: Payer: Self-pay

## 2024-10-05 DIAGNOSIS — F4323 Adjustment disorder with mixed anxiety and depressed mood: Secondary | ICD-10-CM

## 2024-10-05 NOTE — Progress Notes (Signed)
 This is session #2 with Ashlee Vincent for individual behavioral health services for anxiety and depression. Patient presents alone. BHP spent 40 minutes with patient.    Patient reports anxious symptoms including racing thoughts, autonomic hyperactivity, and feelings of overwhelm. Pt names depressive symptoms including hopelessness and low self-worth. Symptoms are related to environmental barriers and interpersonal stressors. Patient will likely benefit from individual services with strengths-based intervention and coping skill development with MI to aid decision-making.   This procedure has been fully reviewed with the patient and informed consent has been obtained.   Patient location: Pt seen in clinic via telehealth.   I connected with  Ashlee Vincent on 10/05/24 by a video enabled telemedicine application and verified that I am speaking with the correct person.   I discussed the limitations of evaluation and management by telemedicine. The patient expressed understanding and agreed to proceed.     ASSESSMENT AND PLAN   Current diagnosis:  adjustment reaction with anxiety and depression   We created the following treatment plan at today's appointment:               1)  10/05/24               2)  Decrease anxious and depressive symptoms through self awareness, coping skills, self-care, and supports, and increase overall level of life satisfaction and well-being.                3)  Medication management with Provider.               4)  Next screening due in 6 sessions.     HISTORY OF PRESENT ILLNESS   Mental Status: Ashlee Vincent presented oriented X4. Mood and affect were anxious and depressed. Judgment was appropriate. Memory was appropriate. Thought processes and content were appropriate. Patient denies SI/HI.     Narrative: Met with pt for scheduled appointment. Person-centered approach used to continue therapeutic alliance. Pt describes increased anxious symptoms including panic  attacks and increased tearfulness. Explored concerns noting themes of pressure, responsibility, power and control. Suggested looking for exceptions and strengths. Psychoeducation provided through the Hand Model of the Brain to normalize experience and identify coping ideas. Noted shortterm and longterm goals. Provided handout of coping strategies. Assessed for additional concerns pt wanted to address which they denied. Scheduled follow up with different provider due to provider's maternity leave.   Effectiveness of the intervention(s) and the beneficiary's response or progress toward goal(s):  Patient is in a contemplative stage of change to engage in treatment plan

## 2024-10-26 ENCOUNTER — Ambulatory Visit: Payer: Self-pay

## 2024-11-02 ENCOUNTER — Ambulatory Visit: Payer: Self-pay | Admitting: Physician Assistant

## 2024-11-02 ENCOUNTER — Other Ambulatory Visit (HOSPITAL_COMMUNITY)
Admission: RE | Admit: 2024-11-02 | Discharge: 2024-11-02 | Disposition: A | Discharge status: Home / Self Care | Payer: Self-pay | Source: Ambulatory Visit | Attending: Physician Assistant | Admitting: Physician Assistant

## 2024-11-02 ENCOUNTER — Encounter: Payer: Self-pay | Admitting: Physician Assistant

## 2024-11-02 ENCOUNTER — Ambulatory Visit: Payer: Self-pay

## 2024-11-02 VITALS — BP 101/62 | HR 84 | Temp 97.5°F | Ht 59.5 in | Wt 192.5 lb

## 2024-11-02 DIAGNOSIS — Z124 Encounter for screening for malignant neoplasm of cervix: Secondary | ICD-10-CM | POA: Insufficient documentation

## 2024-11-02 DIAGNOSIS — F4323 Adjustment disorder with mixed anxiety and depressed mood: Secondary | ICD-10-CM

## 2024-11-02 MED ORDER — SERTRALINE HCL 50 MG PO TABS: ORAL_TABLET | ORAL | 0 refills | Status: DC

## 2024-11-02 NOTE — Progress Notes (Signed)
 BP 101/62   Pulse 84   Temp (!) 97.5 F (36.4 C)   Ht 4' 11.5 (1.511 m)   Wt 192 lb 8 oz (87.3 kg)   LMP 07/12/2024 (Approximate)   SpO2 99%   BMI 38.23 kg/m    Subjective:    Patient ID: Ashlee Vincent, female    DOB: [birth year], 28 y.o.   MRN: [id number]  HPI: Ashlee Vincent is a 28 y.o. female presenting on 11/02/2024 for Gynecologic Exam   HPI  Chief Complaint  Patient presents with   Gynecologic Exam      Pt says she got mirena 3 or 4 years ago and it lasts for five years.  US  showed IUD in correct location so she does not need depo.    Pt says she is having a lot of difficulty with MH.  She denies SI, HI.  She is seeing [facility] which she says is helpful but she is still having a difficult time.  She says she is ready to try medication. She has never before been on any MH medication.   Relevant past medical, surgical, family and social history reviewed and updated as indicated. Interim medical history since our last visit reviewed. Allergies and medications reviewed and updated.   Current Outpatient Medications:    medroxyPROGESTERone  (DEPO-PROVERA ) 150 MG/ML injection, Inject 1 mL (150 mg total) into the muscle every 3 (three) months., Disp: , Rfl:     Review of Systems  Per HPI unless specifically indicated above     Objective:    BP 101/62   Pulse 84   Temp (!) 97.5 F (36.4 C)   Ht 4' 11.5 (1.511 m)   Wt 192 lb 8 oz (87.3 kg)   LMP 07/12/2024 (Approximate)   SpO2 99%   BMI 38.23 kg/m   Wt Readings from Last 3 Encounters:  11/02/24 192 lb 8 oz (87.3 kg)  08/18/24 189 lb (85.7 kg)  08/10/24 193 lb 4 oz (87.7 kg)    Physical Exam Vitals and nursing note reviewed. Exam conducted with a chaperone present.  Constitutional:      General: She is not in acute distress.    Appearance: She is well-developed. She is not toxic-appearing.  Pulmonary:     Effort: Pulmonary effort is normal.  Chest:  Breasts:    Right: Normal.     Left: Normal.   Abdominal:     Palpations: Abdomen is soft. There is no mass.     Tenderness: There is no abdominal tenderness. There is no guarding or rebound.  Genitourinary:    Labia:        Right: No rash, tenderness or lesion.        Left: No rash, tenderness or lesion.      Vagina: Normal.     Cervix: No cervical motion tenderness, discharge or friability.     Uterus: Normal.      Adnexa:        Right: No mass, tenderness or fullness.         Left: No mass, tenderness or fullness.       Comments: Nurse Audrene assisted Skin:    General: Skin is warm and dry.  Neurological:     Mental Status: She is alert and oriented to person, place, and time.  Psychiatric:        Behavior: Behavior normal.           Assessment & Plan:    Encounter Diagnoses  Name Primary?  Routine cervical smear Yes   Adjustment reaction with anxiety and depression       -rx sertraline  to start 25mg  for 6 days then increase to 50mg  -pt to [facility] with [facility] -pt will follow up in three weeks.  She is to contact office sooner if she has any worsening or problems -discussed that since IUD is in proper place, she does not need the depo -record release signed and faxed to [facility] to get information on brand of IUD and date of placement -pt counseled on BSE

## 2024-11-08 ENCOUNTER — Ambulatory Visit: Payer: Self-pay | Admitting: Physician Assistant

## 2024-11-08 ENCOUNTER — Ambulatory Visit: Payer: Self-pay

## 2024-11-08 LAB — CYTOLOGY - PAP
Adequacy: ABSENT
Comment: NEGATIVE
High risk HPV: NEGATIVE

## 2024-11-22 ENCOUNTER — Ambulatory Visit: Payer: Self-pay

## 2024-11-25 ENCOUNTER — Ambulatory Visit: Payer: Self-pay | Admitting: Physician Assistant

## 2024-11-25 ENCOUNTER — Encounter: Payer: Self-pay | Admitting: Physician Assistant

## 2024-11-25 VITALS — BP 103/68 | HR 76 | Temp 97.3°F | Wt 197.0 lb

## 2024-11-25 DIAGNOSIS — Z975 Presence of (intrauterine) contraceptive device: Secondary | ICD-10-CM | POA: Insufficient documentation

## 2024-11-25 DIAGNOSIS — F4323 Adjustment disorder with mixed anxiety and depressed mood: Secondary | ICD-10-CM | POA: Insufficient documentation

## 2024-11-25 MED ORDER — SERTRALINE HCL 50 MG PO TABS: 50.0000 mg | ORAL_TABLET | Freq: Every day | ORAL | 1 refills | Status: AC

## 2024-11-25 NOTE — Progress Notes (Signed)
" ° °  BP 103/68   Pulse 76   Temp (!) 97.3 F (36.3 C)   Wt 197 lb (89.4 kg)   LMP 07/12/2024 (Approximate)   SpO2 98%   BMI 39.12 kg/m    Subjective:    Patient ID: Ashlee Vincent, female    DOB: 1996-06-28, 29 y.o.   MRN: [id number]  HPI: Sherrian Nunnelley is a 29 y.o. female presenting on 11/25/2024 for mood   HPI  Chief Complaint  Patient presents with   mood    She Started on sertraline  on 11/02/24.  She says she has noticed improvement in her symptoms.  She says it doesn't cure her symptoms, just makes them less.  She has appointment with counselor next Monday.  Records received from [facility]-  Mirena placed 05/09/2021  Relevant past medical, surgical, family and social history reviewed and updated as indicated. Interim medical history since our last visit reviewed. Allergies and medications reviewed and updated.   Current Outpatient Medications:    sertraline  (ZOLOFT ) 50 MG tablet, 1/2 tab po every day x 6 days then 1 po qd, Disp: 30 tablet, Rfl: 0    Review of Systems  Per HPI unless specifically indicated above     Objective:    BP 103/68   Pulse 76   Temp (!) 97.3 F (36.3 C)   Wt 197 lb (89.4 kg)   LMP 07/12/2024 (Approximate)   SpO2 98%   BMI 39.12 kg/m   Wt Readings from Last 3 Encounters:  11/25/24 197 lb (89.4 kg)  11/02/24 192 lb 8 oz (87.3 kg)  08/18/24 189 lb (85.7 kg)    Physical Exam Constitutional:      General: She is not in acute distress.    Appearance: She is not toxic-appearing.  HENT:     Head: Normocephalic and atraumatic.  Pulmonary:     Effort: Pulmonary effort is normal.  Skin:    General: Skin is warm and dry.  Neurological:     Mental Status: She is alert and oriented to person, place, and time.  Psychiatric:        Attention and Perception: Attention normal.        Mood and Affect: Affect is not inappropriate.        Speech: Speech normal.        Behavior: Behavior normal. Behavior is cooperative.            Assessment & Plan:    Encounter Diagnoses  Name Primary?   Adjustment reaction with anxiety and depression Yes   IUD (intrauterine device) in place     Continue current sertraline  and continue with [facility].  Pt will follow up six weeks.  She is to contact office sooner prn   "

## 2024-11-29 ENCOUNTER — Ambulatory Visit: Payer: Self-pay

## 2024-12-20 ENCOUNTER — Ambulatory Visit: Payer: Self-pay

## 2025-01-03 ENCOUNTER — Ambulatory Visit: Payer: Self-pay | Admitting: Physician Assistant
